# Patient Record
Sex: Female | Born: 1940 | Race: White | Hispanic: No | Smoking: Current every day smoker
Health system: Southern US, Community
[De-identification: ages and names within clinical notes are randomized; demographics above are authoritative.]

## PROBLEM LIST (undated history)

## (undated) DIAGNOSIS — E7800 Pure hypercholesterolemia, unspecified: Secondary | ICD-10-CM

## (undated) DIAGNOSIS — M549 Dorsalgia, unspecified: Secondary | ICD-10-CM

## (undated) DIAGNOSIS — F488 Other specified nonpsychotic mental disorders: Secondary | ICD-10-CM

## (undated) DIAGNOSIS — G8929 Other chronic pain: Secondary | ICD-10-CM

## (undated) DIAGNOSIS — F0390 Unspecified dementia without behavioral disturbance: Secondary | ICD-10-CM

## (undated) DIAGNOSIS — J449 Chronic obstructive pulmonary disease, unspecified: Secondary | ICD-10-CM

## (undated) HISTORY — PX: CHOLECYSTECTOMY: SHX55

## (undated) HISTORY — PX: HIP SURGERY: SHX245

---

## 2002-03-10 ENCOUNTER — Ambulatory Visit (HOSPITAL_COMMUNITY): Admission: RE | Admit: 2002-03-10 | Discharge: 2002-03-10 | Payer: Self-pay | Admitting: Internal Medicine

## 2002-03-10 ENCOUNTER — Encounter: Payer: Self-pay | Admitting: Internal Medicine

## 2002-04-14 ENCOUNTER — Other Ambulatory Visit: Admission: RE | Admit: 2002-04-14 | Discharge: 2002-04-14 | Payer: Self-pay | Admitting: *Deleted

## 2003-03-11 ENCOUNTER — Encounter: Payer: Self-pay | Admitting: *Deleted

## 2003-03-11 ENCOUNTER — Ambulatory Visit (HOSPITAL_COMMUNITY): Admission: RE | Admit: 2003-03-11 | Discharge: 2003-03-11 | Payer: Self-pay | Admitting: *Deleted

## 2003-04-20 ENCOUNTER — Ambulatory Visit (HOSPITAL_COMMUNITY): Admission: RE | Admit: 2003-04-20 | Discharge: 2003-04-20 | Payer: Self-pay | Admitting: Internal Medicine

## 2003-04-20 ENCOUNTER — Encounter: Payer: Self-pay | Admitting: Internal Medicine

## 2004-03-20 ENCOUNTER — Ambulatory Visit (HOSPITAL_COMMUNITY): Admission: RE | Admit: 2004-03-20 | Discharge: 2004-03-20 | Payer: Self-pay | Admitting: *Deleted

## 2004-06-06 ENCOUNTER — Ambulatory Visit (HOSPITAL_COMMUNITY): Admission: RE | Admit: 2004-06-06 | Discharge: 2004-06-06 | Payer: Self-pay | Admitting: *Deleted

## 2005-10-16 HISTORY — PX: COLONOSCOPY: SHX174

## 2008-04-07 ENCOUNTER — Ambulatory Visit (HOSPITAL_COMMUNITY): Admission: RE | Admit: 2008-04-07 | Discharge: 2008-04-07 | Payer: Self-pay | Admitting: Internal Medicine

## 2009-12-12 ENCOUNTER — Encounter: Payer: Self-pay | Admitting: Pulmonary Disease

## 2010-01-11 ENCOUNTER — Ambulatory Visit: Payer: Self-pay | Admitting: Pulmonary Disease

## 2010-01-11 DIAGNOSIS — I6350 Cerebral infarction due to unspecified occlusion or stenosis of unspecified cerebral artery: Secondary | ICD-10-CM | POA: Insufficient documentation

## 2010-01-11 DIAGNOSIS — E785 Hyperlipidemia, unspecified: Secondary | ICD-10-CM

## 2010-01-11 DIAGNOSIS — E119 Type 2 diabetes mellitus without complications: Secondary | ICD-10-CM | POA: Insufficient documentation

## 2010-01-11 DIAGNOSIS — J309 Allergic rhinitis, unspecified: Secondary | ICD-10-CM | POA: Insufficient documentation

## 2010-01-11 DIAGNOSIS — J438 Other emphysema: Secondary | ICD-10-CM | POA: Insufficient documentation

## 2010-12-24 ENCOUNTER — Encounter: Payer: Self-pay | Admitting: *Deleted

## 2011-01-02 NOTE — Assessment & Plan Note (Signed)
Summary: self referral for emphysema, dyspnea   Copy to:  Self Referral Primary Provider/Referring Provider:  Toni Amend in [place], [place]  CC:  Pulmonary Consult.  History of Present Illness: The pt is a 70y/o female who comes in today for evaluation of dyspnea.  She has longstanding doe and has been told that she has COPD, but her sob has worsened to the point of interfering with any QOL.  She describes less than one block doe at a moderate pace on flat ground, and can also get sob just walking thru her house at times.  She will also get winded bringing a bag of groceries in from the car.  She has intermittant cough with white mucus, and this occurs primarily in the am's upon arising.  She has a long history of smoking, but quit 2 weeks ago.  She currently is on no bronchodilators.  She had a recent cxr last month with no acute process, but has not had pfts since 2003.  She tells me that she wears oxygen at 2lpm at bedtime.  Preventive Screening-Counseling & Management  Alcohol-Tobacco     Smoking Status: quit  Current Medications (verified): 1)  Lithium Carbonate 300 Mg Caps (Lithium Carbonate) .... Take 1 Tablet By Mouth Two Times A Day 2)  Klonopin 0.5 Mg Tabs (Clonazepam) .... Take 1 Tablet By Mouth Two Times A Day 3)  Thiothixene 2 Mg Caps (Thiothixene) .... Take 2 Tabs By Mouth At Bedtime 4)  Aggrenox 25-200 Mg Xr12h-Cap (Aspirin-Dipyridamole) .... Take 1 Tablet By Mouth Two Times A Day 5)  Metformin Hcl 500 Mg Tabs (Metformin Hcl) .... Take 1 Tablet By Mouth Once A Day 6)  Fluoxetine Hcl 20 Mg Caps (Fluoxetine Hcl) .... Take 1 Tablet By Mouth Once A Day 7)  Simvastatin 40 Mg Tabs (Simvastatin) .... Take 1 Tablet By Mouth Once A Day  Allergies (verified): 1)  ! 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 emphysema: brother (copd), daughter allergies: daughters asthma: daughters cancer: sister (ovarian) sister ( cervix)   Social History: Reviewed history and no changes required. Patient states former smoker.  started at age 32.  1 ppd.  Quit Jan 2011 pt is widowed. pt has children. pt lives with self and a son. pt is a Arts development officer. Smoking Status:  quit  Review of Systems       The patient complains of shortness of breath with activity, productive cough, non-productive cough, indigestion, sneezing, itching, anxiety, depression, hand/feet swelling, and joint stiffness or pain.  The patient denies shortness of breath at rest, coughing up blood, chest pain, irregular heartbeats, acid heartburn, loss of appetite, weight change, abdominal pain, difficulty swallowing, sore throat, tooth/dental problems, headaches, nasal congestion/difficulty breathing through nose, ear ache, rash, change in color of mucus, and fever.    Vital Signs:  Patient profile:   70 year old female Height:      63 inches Weight:      138.25 pounds BMI:     24.58 O2 Sat:      90 % on Room air Temp:     98.1 degrees F oral Pulse rate:   87 / minute BP sitting:   116 / 64  (left arm) Cuff size:   regular  Vitals Entered By: Arman Filter LPN (January 11, 2010 2:47 PM)  O2  Flow:  Room air CC: Pulmonary Consult Comments Medications reviewed with patient  Arman Filter LPN  January 11, 2010 2:47 PM    Physical Exam  General:  wd female in nad Eyes:  PERRLA and EOMI.   Nose:  patent without discharge Mouth:  clear  Neck:  no jvd, tmg, LN Lungs:  decreased bs bilat, no wheezing or rhonchi Heart:  rrr, no mrg Abdomen:  soft and nontender, bs+ Extremities:  no edema noted, no cyanosis pulses intact distally Neurologic:  alert and oriented, moves all 4.   Impression & Recommendations:  Problem # 1:  EMPHYSEMA (ICD-492.8) the pt significant doe that I suspect is due to  underlying emphysema.  She will need full pfts for verification.  She is not on any bronchodilators, and therefore will start her on symbicort/spiriva combination.  However, I have told her there is no medication that will make her better if she does not completely stop smoking.  Will start her on meds, and see her back in 3 weeks with full pfts.  I have also stressed to her the importance of an exercise/conditioning program.  Medications Added to Medication List This Visit: 1)  Lithium Carbonate 300 Mg Caps (Lithium carbonate) .... Take 1 tablet by mouth two times a day 2)  Klonopin 0.5 Mg Tabs (Clonazepam) .... Take 1 tablet by mouth two times a day 3)  Thiothixene 2 Mg Caps (Thiothixene) .... Take 2 tabs by mouth at bedtime 4)  Aggrenox 25-200 Mg Xr12h-cap (Aspirin-dipyridamole) .... Take 1 tablet by mouth two times a day 5)  Metformin Hcl 500 Mg Tabs (Metformin hcl) .... Take 1 tablet by mouth once a day 6)  Fluoxetine Hcl 20 Mg Caps (Fluoxetine hcl) .... Take 1 tablet by mouth once a day 7)  Simvastatin 40 Mg Tabs (Simvastatin) .... Take 1 tablet by mouth once a day 8)  Spiriva Handihaler 18 Mcg Caps (Tiotropium bromide monohydrate) .... One puff in handihaler daily 9)  Proair Hfa 108 (90 Base) Mcg/act Aers (Albuterol sulfate) .... 2 puffs every 4-6 hours as needed 10)  Symbicort 160-4.5 Mcg/act Aero (Budesonide-formoterol fumarate) .... Two puffs twice daily  Other Orders: New Patient Level IV (25366) Pulmonary Referral (Pulmonary)  Patient Instructions: 1)  start spiriva one inhalation each am 2)  symbicort 160/4.5  2 inhalations am and pm 3)  proair 2 puffs every 6hrs only if needed for emergencies 4)  Stay completely away from cigarettes. 5)  will schedule for breathing tests with followup visit same day with me in 3 weeks.  Prescriptions: SYMBICORT 160-4.5 MCG/ACT  AERO (BUDESONIDE-FORMOTEROL FUMARATE) Two puffs twice daily  #1 x 6   Entered and Authorized by:   Barbaraann Share  MD   Signed by:   Barbaraann Share MD on 01/11/2010   Method used:   Print then Give to Patient   RxID:   [id number] PROAIR HFA 108 (90 BASE) MCG/ACT  AERS (ALBUTEROL SULFATE) 2 puffs every 4-6 hours as needed  #1 x 6   Entered and Authorized by:   Barbaraann Share MD   Signed by:   Barbaraann Share MD on 01/11/2010   Method used:   Print then Give to Patient   RxID:   [id number] SPIRIVA HANDIHALER 18 MCG  CAPS (TIOTROPIUM BROMIDE MONOHYDRATE) one puff in handihaler daily  #30 x 6   Entered and Authorized by:   Barbaraann Share MD   Signed by:   Barbaraann Share MD on  01/11/2010   Method used:   Print then Give to Patient   RxID:   [id number]   Appended Document: self referral for emphysema, dyspnea received records from Dr. Nickola Major office and put in your very important look at folder.

## 2011-02-26 HISTORY — PX: ESOPHAGOGASTRODUODENOSCOPY ENDOSCOPY: SHX5814

## 2012-05-31 ENCOUNTER — Encounter (HOSPITAL_COMMUNITY): Payer: Self-pay

## 2012-05-31 ENCOUNTER — Emergency Department (HOSPITAL_COMMUNITY)
Admission: EM | Admit: 2012-05-31 | Discharge: 2012-06-01 | Disposition: A | Discharge status: Home / Self Care | Payer: Medicare Other | Attending: Emergency Medicine | Admitting: Emergency Medicine

## 2012-05-31 DIAGNOSIS — E119 Type 2 diabetes mellitus without complications: Secondary | ICD-10-CM | POA: Insufficient documentation

## 2012-05-31 DIAGNOSIS — F17200 Nicotine dependence, unspecified, uncomplicated: Secondary | ICD-10-CM | POA: Insufficient documentation

## 2012-05-31 DIAGNOSIS — R1013 Epigastric pain: Secondary | ICD-10-CM | POA: Insufficient documentation

## 2012-05-31 DIAGNOSIS — R1011 Right upper quadrant pain: Secondary | ICD-10-CM | POA: Insufficient documentation

## 2012-05-31 DIAGNOSIS — E7800 Pure hypercholesterolemia, unspecified: Secondary | ICD-10-CM | POA: Insufficient documentation

## 2012-05-31 DIAGNOSIS — R109 Unspecified abdominal pain: Secondary | ICD-10-CM

## 2012-05-31 HISTORY — DX: Other specified nonpsychotic mental disorders: F48.8

## 2012-05-31 HISTORY — DX: Pure hypercholesterolemia, unspecified: E78.00

## 2012-05-31 MED ORDER — PANTOPRAZOLE SODIUM 40 MG IV SOLR
40.0000 mg | Freq: Once | INTRAVENOUS | Status: AC
  Administered 2012-06-01: 40 mg via INTRAVENOUS
  Filled 2012-05-31: qty 40

## 2012-05-31 MED ORDER — ONDANSETRON HCL 4 MG/2ML IJ SOLN
4.0000 mg | Freq: Once | INTRAMUSCULAR | Status: AC
  Administered 2012-06-01: 4 mg via INTRAVENOUS
  Filled 2012-05-31: qty 2

## 2012-05-31 MED ORDER — MORPHINE SULFATE 2 MG/ML IJ SOLN
2.0000 mg | Freq: Once | INTRAMUSCULAR | Status: AC
  Administered 2012-06-01: 2 mg via INTRAVENOUS
  Filled 2012-05-31: qty 1

## 2012-05-31 MED ORDER — SODIUM CHLORIDE 0.9 % IV SOLN
Freq: Once | INTRAVENOUS | Status: AC
  Administered 2012-06-01: via INTRAVENOUS

## 2012-05-31 NOTE — ED Notes (Signed)
Feels like my gallbladder is acting up. Every time I eat, I burp per pt.

## 2012-05-31 NOTE — ED Provider Notes (Signed)
History   This chart was scribed for [employer]. Colon Branch, MD by Melba Coon. The patient was seen in room APA09/APA09 and the patient's care was started at 11:47PM.    CSN: [id number]  Arrival date & time 05/31/12  2310   First MD Initiated Contact with Patient 05/31/12 2340      Chief Complaint  Patient presents with  . Abdominal Pain    (Consider location/radiation/quality/duration/timing/severity/associated sxs/prior treatment) HPI Sara Silva is a 71 y.o. female who presents to the Emergency Department complaining of constant, moderate to severe abdominal pain with an onset 2 weeks ago. Pt believes that it may be her gall bladder; has had gall bladder attacks in the past. Eating aggravates the pain; burping slightly alleviates the pain. She states that she burps every time she eats, and every time she burps, her abdomen "hardens" . No HA, fever, neck pain, sore throat, rash, back pain, CP, SOB, n/v/d, dysuria, or extremity pain, edema, weakness, numbness, or tingling. Hx of DM. Allergic to sulfonamide derivatives. No other pertinent medical symptoms.  PCP: Dr. Calton Golds in [place]  Past Medical History  Diagnosis Date  . Diabetes mellitus   . High cholesterol   . Nervous breakdown     Past Surgical History  Procedure Date  . Hip surgery     History reviewed. No pertinent family history.  History  Substance Use Topics  . Smoking status: Current Some Day Smoker -- 0.5 packs/day  . Smokeless tobacco: Not on file  . Alcohol Use: No    OB History    Grav Para Term Preterm Abortions TAB SAB Ect Mult Living                  Review of Systems 10 Systems reviewed and all are negative for acute change except as noted in the HPI.   Allergies  Sulfonamide derivatives  Home Medications  No current outpatient prescriptions on file.  BP 139/88  Pulse 74  Temp 98 F (36.7 C) (Oral)  Resp 20  Ht 5\' 3"  (1.6 m)  Wt 111 lb (50.349 kg)  BMI 19.66 kg/m2  SpO2  97%  Physical Exam  Nursing note and vitals reviewed. Constitutional: She is oriented to person, place, and time. She appears well-developed and well-nourished. No distress.  HENT:  Head: Normocephalic and atraumatic.  Right Ear: External ear normal.  Left Ear: External ear normal.  Eyes: EOM are normal.  Neck: Normal range of motion. No tracheal deviation present.  Cardiovascular: Normal rate, regular rhythm and normal heart sounds.   No murmur heard. Pulmonary/Chest: Effort normal. No respiratory distress. She has wheezes (expiratory wheezing bilaterally). She has no rales.  Abdominal: Soft. Bowel sounds are normal. There is tenderness (RUQ and epigastric TTP).  Musculoskeletal: Normal range of motion. She exhibits no edema and no tenderness.  Neurological: She is alert and oriented to person, place, and time.  Skin: Skin is warm and dry. No rash noted.  Psychiatric: She has a normal mood and affect. Her behavior is normal.    ED Course  Procedures (including critical care time)  DIAGNOSTIC STUDIES: Oxygen Saturation is 97% on room air, normal by my interpretation.    COORDINATION OF CARE:  11:52PM - EDMD will order abd CT, blood w/u, protonix injection, and morphine for the pt. Pt wants to come back to the ED for the ultrasound. Results for orders placed during the hospital encounter of 05/31/12  CBC WITH DIFFERENTIAL      Component Value  Range   WBC 6.6  4.0 - 10.5 K/uL   RBC 3.47 (*) 3.87 - 5.11 MIL/uL   Hemoglobin 11.7 (*) 12.0 - 15.0 g/dL   HCT 16.1 (*) 09.6 - 04.5 %   MCV 98.8  78.0 - 100.0 fL   MCH 33.7  26.0 - 34.0 pg   MCHC 34.1  30.0 - 36.0 g/dL   RDW 40.9  81.1 - 91.4 %   Platelets 217  150 - 400 K/uL   Neutrophils Relative 43  43 - 77 %   Neutro Abs 2.9  1.7 - 7.7 K/uL   Lymphocytes Relative 43  12 - 46 %   Lymphs Abs 2.9  0.7 - 4.0 K/uL   Monocytes Relative 6  3 - 12 %   Monocytes Absolute 0.4  0.1 - 1.0 K/uL   Eosinophils Relative 7 (*) 0 - 5 %    Eosinophils Absolute 0.4  0.0 - 0.7 K/uL   Basophils Relative 1  0 - 1 %   Basophils Absolute 0.0  0.0 - 0.1 K/uL  COMPREHENSIVE METABOLIC PANEL      Component Value Range   Sodium 138  135 - 145 mEq/L   Potassium 4.0  3.5 - 5.1 mEq/L   Chloride 104  96 - 112 mEq/L   CO2 27  19 - 32 mEq/L   Glucose, Bld 88  70 - 99 mg/dL   BUN 17  6 - 23 mg/dL   Creatinine, Ser 7.82  0.50 - 1.10 mg/dL   Calcium 9.9  8.4 - 95.6 mg/dL   Total Protein 6.4  6.0 - 8.3 g/dL   Albumin 3.7  3.5 - 5.2 g/dL   AST 15  0 - 37 U/L   ALT 10  0 - 35 U/L   Alkaline Phosphatase 49  39 - 117 U/L   Total Bilirubin 0.2 (*) 0.3 - 1.2 mg/dL   GFR calc non Af Amer 54 (*) >90 mL/min   GFR calc Af Amer 62 (*) >90 mL/min   Dg Abd Acute W/chest  06/01/2012  *RADIOLOGY REPORT*  Clinical Data: Right upper quadrant pain  ACUTE ABDOMEN SERIES (ABDOMEN 2 VIEW & CHEST 1 VIEW)  Comparison: 06/06/2004 chest radiograph  Findings: Hyperinflation.  Nodular opacity projecting over the right lower lobe likely corresponds to a superimposed shadow. Aortic arch atherosclerosis.  Nonobstructive bowel gas pattern.  No free intraperitoneal air. Rightward curvature of the lumbar spine.  No acute osseous finding. Right hip screws are partially imaged. Atherosclerotic vascular calcification.  IMPRESSION: Nonobstructive bowel gas pattern.  Original Report Authenticated By: Waneta Martins, M.D.     MDM  Patient with right upper quadrant and epigastric discomfort that has resulted in pain and burping after eating. She has had similar episodes over the last few months and has been told it is her gallbladder. Laboratory data is normal. Acute abdomen series is negative for any acute findings. She is scheduled for ultrasound on Monday at 2 PM. Referral to surgery has been made. She was given IV fluids, morphine, Zofran, and Protonix with relief of the discomfort and burping.Dx testing d/w pt and family.  Questions answered.  Verb understanding.  Pt feels  improved after observation and/or treatment in ED.Pt stable in ED with no significant deterioration in condition.The patient appears reasonably screened and/or stabilized for discharge and I doubt any other medical condition or other [facility] requiring further screening, evaluation, or treatment in the ED at this time prior to discharge.  I personally  performed the services described in this documentation, which was scribed in my presence. The recorded information has been reviewed and considered.   MDM Reviewed: nursing note and vitals Interpretation: labs and x-ray          Nicoletta Dress. Colon Branch, MD 06/01/12 2126

## 2012-06-01 ENCOUNTER — Emergency Department (HOSPITAL_COMMUNITY): Payer: Medicare Other

## 2012-06-01 LAB — CBC WITH DIFFERENTIAL/PLATELET
Basophils Absolute: 0 10*3/uL (ref 0.0–0.1)
Basophils Relative: 1 % (ref 0–1)
Eosinophils Absolute: 0.4 10*3/uL (ref 0.0–0.7)
Hemoglobin: 11.7 g/dL — ABNORMAL LOW (ref 12.0–15.0)
MCH: 33.7 pg (ref 26.0–34.0)
MCHC: 34.1 g/dL (ref 30.0–36.0)
Monocytes Absolute: 0.4 10*3/uL (ref 0.1–1.0)
Monocytes Relative: 6 % (ref 3–12)
Neutrophils Relative %: 43 % (ref 43–77)
RDW: 14 % (ref 11.5–15.5)

## 2012-06-01 LAB — COMPREHENSIVE METABOLIC PANEL
AST: 15 U/L (ref 0–37)
Albumin: 3.7 g/dL (ref 3.5–5.2)
BUN: 17 mg/dL (ref 6–23)
Creatinine, Ser: 1.02 mg/dL (ref 0.50–1.10)
Potassium: 4 mEq/L (ref 3.5–5.1)
Total Protein: 6.4 g/dL (ref 6.0–8.3)

## 2012-06-01 MED ORDER — HYDROCODONE-ACETAMINOPHEN 5-325 MG PO TABS: 1.0000 | ORAL_TABLET | ORAL | Status: AC | PRN

## 2012-06-01 MED ORDER — ONDANSETRON HCL 4 MG PO TABS: 4.0000 mg | ORAL_TABLET | Freq: Four times a day (QID) | ORAL | Status: AC

## 2012-06-01 NOTE — ED Notes (Signed)
Outpatient [place] was scheduled for Monday morning. Pt called and canceled with Samule Ohm from X-Ray. Pt stated that she was going to go somewhere closer to home.

## 2012-06-01 NOTE — Discharge Instructions (Signed)
Your bloodwork was normal here tonight. Your xrays were normal except for a nodule in the lung that can be followed by your primary care doctor. You are scheduled for an ultrasound on Monday, June 02, 2012 at 2:00 PM. Go directly to the radiology department. Once your ultrasound is finished you will go to the ER waiting room and the ER doctor on duty will review your results with you. Have nothing to eat or drink for 6 hours prior to the ultrasound. I have given you the na,es of 2 surgeons in [place]. Use the pain and nausea medicine as needed.

## 2012-06-02 ENCOUNTER — Other Ambulatory Visit (HOSPITAL_COMMUNITY): Payer: Medicare Other

## 2015-01-03 ENCOUNTER — Encounter: Payer: Self-pay | Admitting: Nurse Practitioner

## 2015-01-03 ENCOUNTER — Ambulatory Visit (INDEPENDENT_AMBULATORY_CARE_PROVIDER_SITE_OTHER): Payer: Medicare Other | Admitting: Nurse Practitioner

## 2015-01-03 VITALS — BP 126/73 | HR 95 | Temp 97.6°F | Ht 62.0 in | Wt 125.0 lb

## 2015-01-03 DIAGNOSIS — R1030 Lower abdominal pain, unspecified: Secondary | ICD-10-CM

## 2015-01-03 DIAGNOSIS — R109 Unspecified abdominal pain: Secondary | ICD-10-CM | POA: Insufficient documentation

## 2015-01-03 DIAGNOSIS — R197 Diarrhea, unspecified: Secondary | ICD-10-CM

## 2015-01-03 NOTE — Progress Notes (Signed)
Primary Care Physician:  Marjo Bicker, MD Primary Gastroenterologist:  Dr. Gala Romney  Chief Complaint  Patient presents with  . Abdominal Pain    HPI:   74 year old female presents for abdominal pain. Has had this consistently in her history and has a history of GERD. EGD on 02/26/11 showed small hiatal hernia, no Barrett's, esophagitis, or stricture. Normal stomach, pyloric channel, and duodenum with exception of antral gastritis. Biopsies of antrum and esophagus taken. Bougle 54 dilator passed. Esophageal bx showed benign squamous mucosa. Antral bx showed mild chronic gastropathy, no H. Pylori. Last colonosocpy on 10/16/2005 showed good prep, good viability, no polyps of inflammatory disease, normal colonoscopy throughout. Was seen at [facility] ER on 10/07/14 for abdominal pain. Records provided by [facility] was copy of patient instructions which showed dx of colitis with Cipro and Flagyl Rx provided as well as norco for pain. CT A&P without contrast done, shows absent gallbladder, liver, spleen, adrenals, and pancreas grossly normal, 2 mm right kidney lower pole stone, no RP adenopathy; pericolonic inflammatory change adjacent to splenic flexure and majority of descending colon and thickening of bowel wall likely mild diffuse colitis; moderate sigmoid diverticulosis without acute inflammation.  Patient is a poor historian, unable to recall medications. Thinks she's on Omeprazole once a day. States GERD symptoms are well controlled on this. States abdominal pain has continued despite treatment. States abdominal pain is intermittent and usually after eating and before having a bowel movement. States the pain is improved after her bowel movement. Has about 4-5 bowel movements a day, sometimes small hard stools and sometimes they're loose. Stools are loose 2-3 days a week. Has had fecal incontinence. Was put on Questran packet by another GI doctor (Dr. Scharlene Gloss) 3 weeks ago in [place] which she states helps  her loose stools but makes her constipated. States she came to see [place] on follow-up after that because she does not like [place]. Denies hematochezia. States her bowel movements have been dark brown, but not black, for the past year with her loose stools. Denies N/V since ER visit, denies dysphagia. Denies any other upper or lower GI symptoms.  Past Medical History  Diagnosis Date  . Diabetes mellitus   . High cholesterol   . Nervous breakdown     Past Surgical History  Procedure Laterality Date  . Hip surgery      Current Outpatient Prescriptions  Medication Sig Dispense Refill  . aspirin 81 MG tablet Take 81 mg by mouth daily.    . cholestyramine (QUESTRAN) 4 G packet Take 4 g by mouth daily.    . clonazePAM (KLONOPIN) 0.5 MG tablet Take 0.5 mg by mouth 2 (two) times daily.    Marland Kitchen FLUoxetine (PROZAC) 20 MG capsule Take 20 mg by mouth daily.    Marland Kitchen lithium carbonate 300 MG capsule Take 300 mg by mouth 2 (two) times daily with a meal.    . metFORMIN (GLUCOPHAGE) 500 MG tablet Take 500 mg by mouth 2 (two) times daily with a meal.    . QUEtiapine (SEROQUEL) 100 MG tablet Take 100 mg by mouth at bedtime.     No current facility-administered medications for this visit.    Allergies as of 01/03/2015 - Review Complete 01/03/2015  Allergen Reaction Noted  . Sulfonamide derivatives Hives     No family history on file.  History   Social History  . Marital Status: Widowed    Spouse Name: N/A    Number of Children: N/A  .  Years of Education: N/A   Occupational History  . Not on file.   Social History Main Topics  . Smoking status: Current Some Day Smoker -- 1.00 packs/day    Types: Cigarettes  . Smokeless tobacco: Not on file  . Alcohol Use: No  . Drug Use: No  . Sexual Activity: Not on file   Other Topics Concern  . Not on file   Social History Narrative    Review of Systems: Gen: Denies any fever, chills, fatigue, weight loss, lack of appetite.  CV: Denies chest pain,  heart palpitations, peripheral edema, syncope.  Resp: Denies shortness of breath at rest or with exertion. Denies wheezing or cough.  GI: See HPI. GU : Denies urinary burning, urinary frequency, urinary hesitancy MS: Denies joint pain, muscle weakness, cramps, or limitation of movement.  Derm: Denies rash, itching, dry skin Psych: Denies depression, anxiety, memory loss, and confusion Heme: Denies bruising, bleeding, and enlarged lymph nodes.  Physical Exam: BP 126/73 mmHg  Pulse 95  Temp(Src) 97.6 F (36.4 C)  Ht 5\' 2"  (1.575 m)  Wt 125 lb (56.7 kg)  BMI 22.86 kg/m2 General:   Alert and oriented. Pleasant and cooperative. Well-nourished and well-developed.  Head:  Normocephalic and atraumatic. Eyes:  Without icterus, sclera clear and conjunctiva pink.  Ears:  Normal auditory acuity. Nose:  No deformity, discharge,  or lesions. Mouth:  No deformity or lesions, oral mucosa pink.  Neck:  Supple, without mass or thyromegaly. Lungs:  Clear to auscultation bilaterally. No wheezes, rales, or rhonchi. No distress.  Heart:  S1, S2 present without murmurs appreciated.  Abdomen:  +BS, soft, non-tender and non-distended. No HSM noted. No guarding or rebound. No masses appreciated.  Rectal:  Deferred  Msk:  Symmetrical without gross deformities. Normal posture. Pulses:  Normal pulses noted. Extremities:  Without clubbing or edema. Neurologic:  Alert and  oriented x4;  grossly normal neurologically. Skin:  Intact without significant lesions or rashes. Cervical Nodes:  No significant cervical adenopathy. Psych:  Alert and cooperative. Normal mood and affect.     01/03/2015 11:31 AM

## 2015-01-03 NOTE — Patient Instructions (Signed)
1. We will request the ER records from [facility] 2. Have your labs drawn in the next 1-2 days 3. When you finish the stool test, bring it back to [place] so we can send it to the lab 4. I am trying you on a medication called Viberzi 75 mg twice a day to see if it helps your diarrhea/abdominal pain without making you constipated. We are giving you samples for 2 weeks worth, please call near the end of the 2 weeks and let us know how it's working for you. 5. Return for a follow-up in 3 weeks.

## 2015-01-05 LAB — CBC WITH DIFFERENTIAL/PLATELET
BASOS ABS: 0.1 10*3/uL (ref 0.0–0.1)
Basophils Relative: 1 % (ref 0–1)
EOS PCT: 6 % — AB (ref 0–5)
Eosinophils Absolute: 0.4 10*3/uL (ref 0.0–0.7)
HEMATOCRIT: 39.5 % (ref 36.0–46.0)
HEMOGLOBIN: 13.1 g/dL (ref 12.0–15.0)
Lymphocytes Relative: 38 % (ref 12–46)
Lymphs Abs: 2.7 10*3/uL (ref 0.7–4.0)
MCH: 32.1 pg (ref 26.0–34.0)
MCHC: 33.2 g/dL (ref 30.0–36.0)
MCV: 96.8 fL (ref 78.0–100.0)
MONO ABS: 0.5 10*3/uL (ref 0.1–1.0)
MPV: 10.4 fL (ref 8.6–12.4)
Monocytes Relative: 7 % (ref 3–12)
Neutro Abs: 3.5 10*3/uL (ref 1.7–7.7)
Neutrophils Relative %: 48 % (ref 43–77)
Platelets: 287 10*3/uL (ref 150–400)
RBC: 4.08 MIL/uL (ref 3.87–5.11)
RDW: 14 % (ref 11.5–15.5)
WBC: 7.2 10*3/uL (ref 4.0–10.5)

## 2015-01-05 LAB — COMPREHENSIVE METABOLIC PANEL
ALBUMIN: 3.9 g/dL (ref 3.5–5.2)
ALT: 12 U/L (ref 0–35)
AST: 15 U/L (ref 0–37)
Alkaline Phosphatase: 64 U/L (ref 39–117)
BILIRUBIN TOTAL: 0.3 mg/dL (ref 0.2–1.2)
BUN: 12 mg/dL (ref 6–23)
CHLORIDE: 108 meq/L (ref 96–112)
CO2: 25 meq/L (ref 19–32)
Calcium: 9.6 mg/dL (ref 8.4–10.5)
Creat: 0.76 mg/dL (ref 0.50–1.10)
Glucose, Bld: 65 mg/dL — ABNORMAL LOW (ref 70–99)
Potassium: 4.1 mEq/L (ref 3.5–5.3)
SODIUM: 143 meq/L (ref 135–145)
Total Protein: 6.4 g/dL (ref 6.0–8.3)

## 2015-01-06 NOTE — Assessment & Plan Note (Signed)
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 Associated abdominal pain after eating which is relieved with bowel movement. No brbpr or mucus noted. Question of melena although seems to be more diet-related darker stools. Will check CBC, iFOBT, and CMP. Will trial on Viberzi samples x 2 weeks to see if this helps her symptoms. Patient to notify us of how this works for her and will call in Rx if effective. Return for follow-up in 3 weeks to reassess. Was seen in November at [facility] for colitis with abx treatment, but diarrhea unlikely infectious post antibiotics because of duration. If no improvement at follow-up or something abnormal in labs can consider further workup including possible imaging or colonoscopy.

## 2015-01-06 NOTE — Assessment & Plan Note (Signed)
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 Associated abdominal pain after eating which is relieved with bowel movement. No brbpr or mucus noted. Question of melena although seems to be more diet-related darker stools. Will check CBC, iFOBT, and CMP. Will trial on Viberzi samples x 2 weeks to see if this helps her symptoms. Patient to notify us of how this works for her and will call in Rx if effective. Return for follow-up in 3 weeks to reassess. Was seen in November at [facility] for colitis with abx treatment, but diarrhea unlikely infectious post antibiotics because of duration. If no improvement at follow-up or something abnormal in labs can consider further workup including possible imaging or colonoscopy.

## 2015-01-12 ENCOUNTER — Ambulatory Visit (INDEPENDENT_AMBULATORY_CARE_PROVIDER_SITE_OTHER): Payer: Medicare Other

## 2015-01-12 DIAGNOSIS — R109 Unspecified abdominal pain: Secondary | ICD-10-CM

## 2015-01-12 NOTE — Progress Notes (Signed)
PT returned one iFOBT and it was negative.

## 2015-01-13 LAB — IFOBT (OCCULT BLOOD): IMMUNOLOGICAL FECAL OCCULT BLOOD TEST: NEGATIVE

## 2015-01-18 NOTE — Progress Notes (Signed)
Quick Note:  Pt is aware of result. She said she only took 4 of the Viberzi and they locked up her bowels. She had to take some Milk of magnesia. She is doing better but still has problems with bowels being loose. Please advise! ______

## 2015-01-22 NOTE — Progress Notes (Signed)
CC'ED TO PCP 

## 2015-02-18 ENCOUNTER — Ambulatory Visit (INDEPENDENT_AMBULATORY_CARE_PROVIDER_SITE_OTHER): Payer: Medicare Other | Admitting: Internal Medicine

## 2015-02-18 ENCOUNTER — Other Ambulatory Visit: Payer: Self-pay

## 2015-02-18 ENCOUNTER — Encounter: Payer: Self-pay | Admitting: Internal Medicine

## 2015-02-18 VITALS — BP 133/72 | HR 91 | Temp 97.0°F | Ht 62.0 in | Wt 124.6 lb

## 2015-02-18 DIAGNOSIS — R194 Change in bowel habit: Secondary | ICD-10-CM

## 2015-02-18 DIAGNOSIS — R6881 Early satiety: Secondary | ICD-10-CM

## 2015-02-18 DIAGNOSIS — R198 Other specified symptoms and signs involving the digestive system and abdomen: Secondary | ICD-10-CM

## 2015-02-18 DIAGNOSIS — K921 Melena: Secondary | ICD-10-CM | POA: Diagnosis not present

## 2015-02-18 MED ORDER — PEG 3350-KCL-NA BICARB-NACL 420 G PO SOLR: 4000.0000 mL | ORAL | Status: DC

## 2015-02-18 NOTE — Progress Notes (Signed)
Primary Care Physician:  Marjo Bicker, MD Primary Gastroenterologist:  Dr. Gala Romney  Pre-Procedure History & Physical: HPI:  Sara Silva is a 74 y.o. female here for for evaluation of altered bowel function.  Has had issues with diarrhea over the past couple of years occasionally swings back constipation. Has been on Questran which made her too constipated. We saw her recently and tried a course of Verbizi; -this agent as well "locked her up"; she stopped taking it. No hematochezia. She describes melena intermittently. She also describes early satiety and frequent reflux symptoms on no acid suppression therapy. No dysphagia or odynophagia. Denies weight loss at this time. It's been 10 years since she had a colonoscopy; 4 years since she had an EGD. No nonsteroidal agents.  Recent CBC looked good except for slightly elevated eosinophil count (6%); Chem-12 look good except for a slightly depressed blood glucose  Past Medical History  Diagnosis Date  . Diabetes mellitus   . High cholesterol   . Nervous breakdown     Past Surgical History  Procedure Laterality Date  . Hip surgery    . Esophagogastroduodenoscopy endoscopy  02/26/11    Dr.Shifflet- small hiatla hernia, normal stomach except antal gastritis. esophageal bx= benign squamous mucosa, antrum bx= mild chronic gastrophathy.   . Colonoscopy  10/16/2005    [place], [place]- no polyps or inflammatory disease, normal tcs.  . Cholecystectomy      Prior to Admission medications   Medication Sig Start Date End Date Taking? Authorizing Provider  aspirin 81 MG tablet Take 81 mg by mouth daily.   Yes Historical Provider, MD  cholestyramine Lucrezia Starch) 4 G packet Take 4 g by mouth daily.   Yes Historical Provider, MD  clonazePAM (KLONOPIN) 0.5 MG tablet Take 0.5 mg by mouth 2 (two) times daily.   Yes Historical Provider, MD  FLUoxetine (PROZAC) 20 MG capsule Take 20 mg by mouth daily.   Yes Historical Provider, MD  lithium carbonate 300  MG capsule Take 300 mg by mouth 2 (two) times daily with a meal.   Yes Historical Provider, MD  metFORMIN (GLUCOPHAGE) 500 MG tablet Take 500 mg by mouth 2 (two) times daily with a meal.   Yes Historical Provider, MD  QUEtiapine (SEROQUEL) 100 MG tablet Take 100 mg by mouth at bedtime.   Yes Historical Provider, MD  simvastatin (ZOCOR) 10 MG tablet Take 10 mg by mouth daily.   Yes Historical Provider, MD    Allergies as of 02/18/2015 - Review Complete 02/18/2015  Allergen Reaction Noted  . Sulfonamide derivatives Hives     Family History  Problem Relation Age of Onset  . Colon cancer Neg Hx   . Ovarian cancer Sister     History   Social History  . Marital Status: Widowed    Spouse Name: N/A  . Number of Children: N/A  . Years of Education: N/A   Occupational History  . Not on file.   Social History Main Topics  . Smoking status: Current Some Day Smoker -- 1.00 packs/day    Types: Cigarettes  . Smokeless tobacco: Not on file  . Alcohol Use: No  . Drug Use: No  . Sexual Activity: Not on file   Other Topics Concern  . Not on file   Social History Narrative    Review of Systems: See HPI, otherwise negative ROS  Physical Exam: BP 133/72 mmHg  Pulse 91  Temp(Src) 97 F (36.1 C)  Ht 5\' 2"  (1.575 m)  Wt 124 lb 9.6 oz (56.518 kg)  BMI 22.78 kg/m2 General:   well-nourished, pleasant and cooperative in NAD. Accompanied by her daughter. Skin:  Intact without significant lesions or rashes. Eyes:  Sclera clear, no icterus.   Conjunctiva pink. Ears:  Normal auditory acuity. Nose:  No deformity, discharge,  or lesions. Mouth:  No deformity or lesions. Neck:  Supple; no masses or thyromegaly. No significant cervical adenopathy. Lungs:  Clear throughout to auscultation.   No wheezes, crackles, or rhonchi. No acute distress. Heart:  Regular rate and rhythm; no murmurs, clicks, rubs,  or gallops. Abdomen: Non-distended, normal bowel sounds.  Soft and nontender without  appreciable mass or hepatosplenomegaly.  Pulses:  Normal pulses noted. Extremities:  Without clubbing or edema. Rectal: Deferred until time of colonoscopy  Impression:  74 year old lady with multiple GI issues as described. Alternating constipation diarrhea with a slant towards diarrhea. Likely irritable bowel syndrome. Microscopic colitis less likely. Has been 10 years and she had a colonoscopy. Early satiety and frequent GERD also issues that need to be dealt into further.  Recommendations:  Offered a diagnostic EGD and colonoscopy to further evaluate early satiety GERD, reported melena and altered bowel function.The risks, benefits, limitations, imponderables and alternatives regarding both EGD and colonoscopy have been reviewed with the patient. Questions have been answered. All parties agreeable.    Notice: This dictation was prepared with Dragon dictation along with smaller phrase technology. Any transcriptional errors that result from this process are unintentional and may not be corrected upon review.

## 2015-02-18 NOTE — Patient Instructions (Addendum)
Schedule a diagnostic colonoscopy and EGD (melena, altered bowel function)  Will need deep sedation with Dr. Patsey Berthold (Propofol)  Split Movie prep  Further recommendations to follow

## 2015-02-21 ENCOUNTER — Other Ambulatory Visit: Payer: Self-pay

## 2015-02-21 MED ORDER — PEG 3350-KCL-NA BICARB-NACL 420 G PO SOLR: 4000.0000 mL | Freq: Once | ORAL | Status: DC

## 2015-02-21 MED ORDER — PEG-KCL-NACL-NASULF-NA ASC-C 100 G PO SOLR
1.0000 | Freq: Once | ORAL | Status: DC
Start: 2015-02-21 — End: 2015-02-21

## 2015-02-24 ENCOUNTER — Telehealth: Payer: Self-pay

## 2015-02-24 NOTE — Telephone Encounter (Signed)
Pt is calling because her insurance will not pay for her TCS until November unless she is at high risk for cancer. Please advise

## 2015-02-27 NOTE — Telephone Encounter (Signed)
This is not a screening tcs; it is a diagnostic test.  They should provide a benefit

## 2015-02-27 NOTE — Telephone Encounter (Signed)
Ginger please call the insurance company to verify insurance benefits, please see [place] office visit note to reference the reason we are doing the tcs.

## 2015-03-01 NOTE — Telephone Encounter (Signed)
Talked with Lorriane Shire at [employer] and they said that unless the patient has cancer they can not do the TCS until November.

## 2015-03-07 ENCOUNTER — Inpatient Hospital Stay (HOSPITAL_COMMUNITY): Admission: RE | Admit: 2015-03-07 | Payer: Medicare Other | Source: Ambulatory Visit

## 2015-03-10 ENCOUNTER — Ambulatory Visit (HOSPITAL_COMMUNITY): Admission: RE | Admit: 2015-03-10 | Payer: Medicare Other | Source: Ambulatory Visit | Admitting: Internal Medicine

## 2015-03-10 ENCOUNTER — Encounter (HOSPITAL_COMMUNITY): Admission: RE | Payer: Self-pay | Source: Ambulatory Visit

## 2015-03-10 SURGERY — COLONOSCOPY WITH PROPOFOL
Anesthesia: Monitor Anesthesia Care

## 2015-03-23 NOTE — Telephone Encounter (Signed)
I previously recommended both an EGD and a colonoscopy as diagnostic maneuvers given her new symptoms. Regardless of what the insurance company dictates, This is what I recommend I feel it is best practice.

## 2015-03-24 NOTE — Telephone Encounter (Signed)
Communication noted.  

## 2015-03-24 NOTE — Telephone Encounter (Addendum)
Pt understands but voices that she can not afford the out of pocket amount for the TCS. I told her that she did not have to pay it all at one time and she understood that but wanted to wait. She will call us if anything changes.

## 2015-08-31 NOTE — Telephone Encounter (Signed)
Pt called to let us know that she could have her TCS on 10/04/15. I will make her a follow up appointment.

## 2015-09-28 ENCOUNTER — Ambulatory Visit: Payer: Medicare Other | Admitting: Gastroenterology

## 2016-01-03 ENCOUNTER — Encounter: Payer: Self-pay | Admitting: Internal Medicine

## 2016-01-03 ENCOUNTER — Other Ambulatory Visit: Payer: Self-pay

## 2016-01-03 ENCOUNTER — Ambulatory Visit (INDEPENDENT_AMBULATORY_CARE_PROVIDER_SITE_OTHER): Payer: Medicare Other | Admitting: Internal Medicine

## 2016-01-03 VITALS — BP 149/75 | HR 87 | Temp 98.4°F | Ht 62.0 in | Wt 120.6 lb

## 2016-01-03 DIAGNOSIS — R634 Abnormal weight loss: Secondary | ICD-10-CM | POA: Diagnosis not present

## 2016-01-03 DIAGNOSIS — K219 Gastro-esophageal reflux disease without esophagitis: Secondary | ICD-10-CM

## 2016-01-03 DIAGNOSIS — R197 Diarrhea, unspecified: Secondary | ICD-10-CM

## 2016-01-03 MED ORDER — PEG 3350-KCL-NA BICARB-NACL 420 G PO SOLR: 4000.0000 mL | Freq: Once | ORAL | Status: DC

## 2016-01-03 NOTE — Patient Instructions (Signed)
Schedule Diagnostic EGD and Colonoscopy (GERD and diarrhea) - pediatric colonoscope  Needs propofol

## 2016-01-03 NOTE — Progress Notes (Signed)
Primary Care Physician:  Marjo Bicker, MD Primary Gastroenterologist:  Dr. Gala Romney  Pre-Procedure History & Physical: HPI:  Sara Silva is a 75 y.o. female here for you for reevaluation of her early satiety, refractory GERD and diarrhea. Patient seen here originally on 02/18/2015. These symptoms were to be worked up with a diagnostic EGD and colonoscopy. Because of insurance constraints, patient was unable to have them done at that time. She is here now and wants to proceed with the evaluation. She has lost 4 pounds and seen last year. She's not passed any blood. Denies dysphagia. She continues taking metformin. GI symptoms unchanged. Last colonoscopy good 11 years ago without significant findings. Last EGD 5 years ago.  She tried Prilosec previously and did not agree with her system. She also tried a course of Viberzi for diarrhea and it "locked her up". She has been taking Questran intermittently which also produced constipation.  Past Medical History  Diagnosis Date  . Diabetes mellitus   . High cholesterol   . Nervous breakdown     Past Surgical History  Procedure Laterality Date  . Hip surgery    . Esophagogastroduodenoscopy endoscopy  02/26/11    Dr.Shifflet- small hiatla hernia, normal stomach except antal gastritis. esophageal bx= benign squamous mucosa, antrum bx= mild chronic gastrophathy.   . Colonoscopy  10/16/2005    [place], [place]- no polyps or inflammatory disease, normal tcs.  . Cholecystectomy      Prior to Admission medications   Medication Sig Start Date End Date Taking? Authorizing Provider  clonazePAM (KLONOPIN) 0.5 MG tablet Take 0.5 mg by mouth 2 (two) times daily.   Yes Historical Provider, MD  FLUoxetine (PROZAC) 20 MG capsule Take 20 mg by mouth daily.   Yes Historical Provider, MD  lithium carbonate 300 MG capsule Take 300 mg by mouth 2 (two) times daily with a meal.   Yes Historical Provider, MD  metFORMIN (GLUCOPHAGE) 500 MG tablet Take 500 mg  by mouth 2 (two) times daily with a meal.   Yes Historical Provider, MD  QUEtiapine (SEROQUEL) 100 MG tablet Take 100 mg by mouth at bedtime.   Yes Historical Provider, MD  simvastatin (ZOCOR) 10 MG tablet Take 10 mg by mouth daily.   Yes Historical Provider, MD  aspirin 81 MG tablet Take 81 mg by mouth daily. Reported on 01/03/2016    Historical Provider, MD  cholestyramine Lucrezia Starch) 4 G packet Take 4 g by mouth daily. Reported on 01/03/2016    Historical Provider, MD  polyethylene glycol-electrolytes (NULYTELY/GOLYTELY) 420 G solution Take 4,000 mLs by mouth once. Patient not taking: Reported on 01/03/2016 02/21/15   Mahala Menghini, PA-C  polyethylene glycol-electrolytes (TRILYTE) 420 G solution Take 4,000 mLs by mouth as directed. Patient not taking: Reported on 01/03/2016 02/18/15   Daneil Dolin, MD    Allergies as of 01/03/2016 - Review Complete 01/03/2016  Allergen Reaction Noted  . Sulfonamide derivatives Hives     Family History  Problem Relation Age of Onset  . Colon cancer Neg Hx   . Ovarian cancer Sister     Social History   Social History  . Marital Status: Widowed    Spouse Name: N/A  . Number of Children: N/A  . Years of Education: N/A   Occupational History  . Not on file.   Social History Main Topics  . Smoking status: Current Some Day Smoker -- 1.00 packs/day    Types: Cigarettes  . Smokeless tobacco: Not on file  .  Alcohol Use: No  . Drug Use: No  . Sexual Activity: Not on file   Other Topics Concern  . Not on file   Social History Narrative    Review of Systems: See HPI, otherwise negative ROS  Physical Exam: BP 149/75 mmHg  Pulse 87  Temp(Src) 98.4 F (36.9 C)  Ht 5\' 2"  (1.575 m)  Wt 120 lb 9.6 oz (54.704 kg)  BMI 22.05 kg/m2 General:   Alert, small framed, elderly lady, pleasant and cooperative in NAD Skin:  Intact without significant lesions or rashes. Neck:  Supple; no masses or thyromegaly. No significant cervical adenopathy. Lungs:   Clear throughout to auscultation.   No wheezes, crackles, or rhonchi. No acute distress. Heart:  Regular rate and rhythm; no murmurs, clicks, rubs,  or gallops. Abdomen: Non-distended, normal bowel sounds.  Soft and nontender without appreciable mass or hepatosplenomegaly.  Pulses:  Normal pulses noted. Extremities:  Without clubbing or edema.  Impression:  Very pleasant 75 year old lady with chronic, refractory GERD, early satiety, weight loss along with chronic diarrhea. Further evaluation recommended as outlined previously. Patient is now able to undergo the evaluation. The possibility of microscopic colitis and medication effect( metformin) needs to be considered along with other entities as a cause for diarrhea. To this end, I have recommended a diagnostic EGD and colonoscopy.  Intolerance to Prilosec noted. The risks, benefits, limitations, imponderables and alternatives regarding both EGD and colonoscopy have been reviewed with the patient. Questions have been answered. All parties agreeable.    Recommendations:  Schedule Diagnostic EGD and Colonoscopy (GERD and diarrhea) - pediatric colonoscope with deep sedation  given co-morbidities and chronic medications.  Further recommendations to follow pending review of endoscopy findings..    Notice: This dictation was prepared with Dragon dictation along with smaller phrase technology. Any transcriptional errors that result from this process are unintentional and may not be corrected upon review.

## 2016-01-04 NOTE — Progress Notes (Signed)
Pt was set up for TCS on 01/26/16 and date had to be changed. Pt is aware of new date 01/30/16 and new instructions are in the mail.

## 2016-01-10 ENCOUNTER — Other Ambulatory Visit: Payer: Self-pay

## 2016-01-11 ENCOUNTER — Telehealth: Payer: Self-pay | Admitting: Internal Medicine

## 2016-01-11 NOTE — Telephone Encounter (Signed)
Called pt NO answer. Phone busy

## 2016-01-11 NOTE — Telephone Encounter (Signed)
Patient called earlier and spoke with CJ about her insurance, codes and upcoming procedure. She called back asking for CJ and I told her that CJ was with another patient and would have to call her back. [phone]

## 2016-01-12 NOTE — Telephone Encounter (Signed)
Spoke with pt and she wanted to inform us verified everything with her insurance

## 2016-01-25 ENCOUNTER — Other Ambulatory Visit: Payer: Self-pay

## 2016-01-25 NOTE — Patient Instructions (Signed)
Sara Silva  01/25/2016     @PREFPERIOPPHARMACY @   Your procedure is scheduled on 01/30/2016 .  Report to [facility] at 11:15 A.M.  Call this number if you have problems the morning of surgery:  [phone]   Remember:  Do not eat food or drink liquids after midnight.  Take these medicines the morning of surgery with A SIP OF WATER Klonopin, Prozac, Seroquel   Do not wear jewelry, make-up or nail polish.  Do not wear lotions, powders, or perfumes.  You may wear deodorant.  Do not shave 48 hours prior to surgery.  Men may shave face and neck.  Do not bring valuables to the hospital.  [facility] is not responsible for any belongings or valuables.  Contacts, dentures or bridgework may not be worn into surgery.  Leave your suitcase in the car.  After surgery it may be brought to your room.  For patients admitted to the hospital, discharge time will be determined by your treatment team.  Patients discharged the day of surgery will not be allowed to drive home.   Please read over the following fact sheets that you were given. Anesthesia Post-op Instructions     PATIENT INSTRUCTIONS POST-ANESTHESIA  IMMEDIATELY FOLLOWING SURGERY:  Do not drive or operate machinery for the first twenty four hours after surgery.  Do not make any important decisions for twenty four hours after surgery or while taking narcotic pain medications or sedatives.  If you develop intractable nausea and vomiting or a severe headache please notify your doctor immediately.  FOLLOW-UP:  Please make an appointment with your surgeon as instructed. You do not need to follow up with anesthesia unless specifically instructed to do so.  WOUND CARE INSTRUCTIONS (if applicable):  Keep a dry clean dressing on the anesthesia/puncture wound site if there is drainage.  Once the wound has quit draining you may leave it open to air.  Generally you should leave the bandage intact for twenty four hours unless there is  drainage.  If the epidural site drains for more than 36-48 hours please call the anesthesia department.  QUESTIONS?:  Please feel free to call your physician or the hospital operator if you have any questions, and they will be happy to assist you.      Esophagogastroduodenoscopy Esophagogastroduodenoscopy (EGD) is a procedure that is used to examine the lining of the esophagus, stomach, and first part of the small intestine (duodenum). A long, flexible, lighted tube with a camera attached (endoscope) is inserted down the throat to view these organs. This procedure is done to detect problems or abnormalities, such as inflammation, bleeding, ulcers, or growths, in order to treat them. The procedure lasts 5-20 minutes. It is usually an outpatient procedure, but it may need to be performed in a hospital in emergency cases. LET [facility] CARE PROVIDER KNOW ABOUT:  Any allergies you have.  All medicines you are taking, including vitamins, herbs, eye drops, creams, and over-the-counter medicines.  Previous problems you or members of your family have had with the use of anesthetics.  Any blood disorders you have.  Previous surgeries you have had.  Medical conditions you have. RISKS AND COMPLICATIONS Generally, this is a safe procedure. However, problems can occur and include:  Infection.  Bleeding.  Tearing (perforation) of the esophagus, stomach, or duodenum.  Difficulty breathing or not being able to breathe.  Excessive sweating.  Spasms of the larynx.  Slowed heartbeat.  Low blood pressure. BEFORE THE PROCEDURE  Do not eat or drink anything after midnight on the night before the procedure or as directed by your health care provider.  Do not take your regular medicines before the procedure if your health care provider asks you not to. Ask your health care provider about changing or stopping those medicines.  If you wear dentures, be prepared to remove them before the  procedure.  Arrange for someone to drive you home after the procedure. PROCEDURE  A numbing medicine (local anesthetic) may be sprayed in your throat for comfort and to stop you from gagging or coughing.  You will have an IV tube inserted in a vein in your hand or arm. You will receive medicines and fluids through this tube.  You will be given a medicine to relax you (sedative).  A pain reliever will be given through the IV tube.  A mouth guard may be placed in your mouth to protect your teeth and to keep you from biting on the endoscope.  You will be asked to lie on your left side.  The endoscope will be inserted down your throat and into your esophagus, stomach, and duodenum.  Air will be put through the endoscope to allow your health care provider to clearly view the lining of your esophagus.  The lining of your esophagus, stomach, and duodenum will be examined. During the exam, your health care provider may:  Remove tissue to be examined under a microscope (biopsy) for inflammation, infection, or other medical problems.  Remove growths.  Remove objects (foreign bodies) that are stuck.  Treat any bleeding with medicines or other devices that stop tissues from bleeding (hot cautery, clipping devices).  Widen (dilate) or stretch narrowed areas of your esophagus and stomach.  The endoscope will be withdrawn. AFTER THE PROCEDURE  You will be taken to a recovery area for observation. Your blood pressure, heart rate, breathing rate, and blood oxygen level will be monitored often until the medicines you were given have worn off.  Do not eat or drink anything until the numbing medicine has worn off and your gag reflex has returned. You may choke.  Your health care provider should be able to discuss his or her findings with you. It will take longer to discuss the test results if any biopsies were taken.   This information is not intended to replace advice given to you by your  health care provider. Make sure you discuss any questions you have with your health care provider.   Document Released: 03/22/2005 Document Revised: 12/10/2014 Document Reviewed: 10/22/2012 Elsevier Interactive Patient Education 2016 [employer]. Colonoscopy A colonoscopy is an exam to look at the entire large intestine (colon). This exam can help find problems such as tumors, polyps, inflammation, and areas of bleeding. The exam takes about 1 hour.  LET [facility] CARE PROVIDER KNOW ABOUT:   Any allergies you have.  All medicines you are taking, including vitamins, herbs, eye drops, creams, and over-the-counter medicines.  Previous problems you or members of your family have had with the use of anesthetics.  Any blood disorders you have.  Previous surgeries you have had.  Medical conditions you have. RISKS AND COMPLICATIONS  Generally, this is a safe procedure. However, as with any procedure, complications can occur. Possible complications include:  Bleeding.  Tearing or rupture of the colon wall.  Reaction to medicines given during the exam.  Infection (rare). BEFORE THE PROCEDURE   Ask your health care provider about changing or stopping your regular medicines.  You may be prescribed an oral bowel prep. This involves drinking a large amount of medicated liquid, starting the day before your procedure. The liquid will cause you to have multiple loose stools until your stool is almost clear or light green. This cleans out your colon in preparation for the procedure.  Do not eat or drink anything else once you have started the bowel prep, unless your health care provider tells you it is safe to do so.  Arrange for someone to drive you home after the procedure. PROCEDURE   You will be given medicine to help you relax (sedative).  You will lie on your side with your knees bent.  A long, flexible tube with a light and camera on the end (colonoscope) will be inserted through  the rectum and into the colon. The camera sends video back to a computer screen as it moves through the colon. The colonoscope also releases carbon dioxide gas to inflate the colon. This helps your health care provider see the area better.  During the exam, your health care provider may take a small tissue sample (biopsy) to be examined under a microscope if any abnormalities are found.  The exam is finished when the entire colon has been viewed. AFTER THE PROCEDURE   Do not drive for 24 hours after the exam.  You may have a small amount of blood in your stool.  You may pass moderate amounts of gas and have mild abdominal cramping or bloating. This is caused by the gas used to inflate your colon during the exam.  Ask when your test results will be ready and how you will get your results. Make sure you get your test results.   This information is not intended to replace advice given to you by your health care provider. Make sure you discuss any questions you have with your health care provider.   Document Released: 11/16/2000 Document Revised: 09/09/2013 Document Reviewed: 07/27/2013 Elsevier Interactive Patient Education [employer].

## 2016-01-26 ENCOUNTER — Other Ambulatory Visit: Payer: Self-pay

## 2016-01-26 ENCOUNTER — Encounter (HOSPITAL_COMMUNITY)
Admission: RE | Admit: 2016-01-26 | Discharge: 2016-01-26 | Disposition: A | Discharge status: Home / Self Care | Payer: Medicare Other | Source: Ambulatory Visit | Attending: Internal Medicine | Admitting: Internal Medicine

## 2016-01-26 ENCOUNTER — Encounter (HOSPITAL_COMMUNITY): Payer: Self-pay

## 2016-01-26 DIAGNOSIS — Z01812 Encounter for preprocedural laboratory examination: Secondary | ICD-10-CM | POA: Diagnosis not present

## 2016-01-26 DIAGNOSIS — Z01810 Encounter for preprocedural cardiovascular examination: Secondary | ICD-10-CM | POA: Insufficient documentation

## 2016-01-26 HISTORY — DX: Pure hypercholesterolemia, unspecified: E78.00

## 2016-01-26 HISTORY — DX: Chronic obstructive pulmonary disease, unspecified: J44.9

## 2016-01-26 HISTORY — DX: Dorsalgia, unspecified: M54.9

## 2016-01-26 HISTORY — DX: Other chronic pain: G89.29

## 2016-01-26 LAB — CBC
HCT: 40.2 % (ref 36.0–46.0)
HEMOGLOBIN: 13.1 g/dL (ref 12.0–15.0)
MCH: 32.4 pg (ref 26.0–34.0)
MCHC: 32.6 g/dL (ref 30.0–36.0)
MCV: 99.5 fL (ref 78.0–100.0)
PLATELETS: 340 10*3/uL (ref 150–400)
RBC: 4.04 MIL/uL (ref 3.87–5.11)
RDW: 15.1 % (ref 11.5–15.5)
WBC: 8.7 10*3/uL (ref 4.0–10.5)

## 2016-01-26 LAB — BASIC METABOLIC PANEL
ANION GAP: 6 (ref 5–15)
BUN: 16 mg/dL (ref 6–20)
CALCIUM: 9.5 mg/dL (ref 8.9–10.3)
CO2: 26 mmol/L (ref 22–32)
CREATININE: 0.66 mg/dL (ref 0.44–1.00)
Chloride: 105 mmol/L (ref 101–111)
Glucose, Bld: 84 mg/dL (ref 65–99)
Potassium: 4.5 mmol/L (ref 3.5–5.1)
SODIUM: 137 mmol/L (ref 135–145)

## 2016-01-26 NOTE — Pre-Procedure Instructions (Signed)
Patient given information to sign up for my chart at home. 

## 2016-01-30 ENCOUNTER — Encounter (HOSPITAL_COMMUNITY): Payer: Self-pay | Admitting: Anesthesiology

## 2016-01-30 ENCOUNTER — Ambulatory Visit (HOSPITAL_COMMUNITY): Payer: Medicare Other | Admitting: Anesthesiology

## 2016-01-30 ENCOUNTER — Encounter (HOSPITAL_COMMUNITY): Admission: RE | Payer: Self-pay | Source: Ambulatory Visit

## 2016-01-30 ENCOUNTER — Ambulatory Visit (HOSPITAL_COMMUNITY): Admission: RE | Admit: 2016-01-30 | Payer: Medicare Other | Source: Ambulatory Visit | Admitting: Internal Medicine

## 2016-01-30 ENCOUNTER — Encounter (HOSPITAL_COMMUNITY)
Admission: RE | Disposition: A | Discharge status: Home / Self Care | Payer: Self-pay | Source: Ambulatory Visit | Attending: Internal Medicine

## 2016-01-30 ENCOUNTER — Ambulatory Visit (HOSPITAL_COMMUNITY)
Admission: RE | Admit: 2016-01-30 | Discharge: 2016-01-30 | Disposition: A | Discharge status: Home / Self Care | Payer: Medicare Other | Source: Ambulatory Visit | Attending: Internal Medicine | Admitting: Internal Medicine

## 2016-01-30 DIAGNOSIS — Z79899 Other long term (current) drug therapy: Secondary | ICD-10-CM | POA: Insufficient documentation

## 2016-01-30 DIAGNOSIS — Q438 Other specified congenital malformations of intestine: Secondary | ICD-10-CM | POA: Diagnosis not present

## 2016-01-30 DIAGNOSIS — K529 Noninfective gastroenteritis and colitis, unspecified: Secondary | ICD-10-CM | POA: Insufficient documentation

## 2016-01-30 DIAGNOSIS — R197 Diarrhea, unspecified: Secondary | ICD-10-CM | POA: Insufficient documentation

## 2016-01-30 DIAGNOSIS — Z7982 Long term (current) use of aspirin: Secondary | ICD-10-CM | POA: Diagnosis not present

## 2016-01-30 DIAGNOSIS — F17210 Nicotine dependence, cigarettes, uncomplicated: Secondary | ICD-10-CM | POA: Diagnosis not present

## 2016-01-30 DIAGNOSIS — K5730 Diverticulosis of large intestine without perforation or abscess without bleeding: Secondary | ICD-10-CM | POA: Insufficient documentation

## 2016-01-30 DIAGNOSIS — K3189 Other diseases of stomach and duodenum: Secondary | ICD-10-CM | POA: Diagnosis not present

## 2016-01-30 DIAGNOSIS — E7800 Pure hypercholesterolemia, unspecified: Secondary | ICD-10-CM | POA: Diagnosis not present

## 2016-01-30 DIAGNOSIS — K2210 Ulcer of esophagus without bleeding: Secondary | ICD-10-CM | POA: Diagnosis not present

## 2016-01-30 DIAGNOSIS — Z7984 Long term (current) use of oral hypoglycemic drugs: Secondary | ICD-10-CM | POA: Insufficient documentation

## 2016-01-30 DIAGNOSIS — K2100 Gastro-esophageal reflux disease with esophagitis, without bleeding: Secondary | ICD-10-CM | POA: Insufficient documentation

## 2016-01-30 DIAGNOSIS — E119 Type 2 diabetes mellitus without complications: Secondary | ICD-10-CM | POA: Diagnosis not present

## 2016-01-30 DIAGNOSIS — K319 Disease of stomach and duodenum, unspecified: Secondary | ICD-10-CM | POA: Insufficient documentation

## 2016-01-30 DIAGNOSIS — K219 Gastro-esophageal reflux disease without esophagitis: Secondary | ICD-10-CM | POA: Diagnosis present

## 2016-01-30 HISTORY — PX: BIOPSY: SHX5522

## 2016-01-30 HISTORY — PX: COLONOSCOPY WITH PROPOFOL: SHX5780

## 2016-01-30 HISTORY — PX: ESOPHAGOGASTRODUODENOSCOPY (EGD) WITH PROPOFOL: SHX5813

## 2016-01-30 LAB — GLUCOSE, CAPILLARY
GLUCOSE-CAPILLARY: 74 mg/dL (ref 65–99)
Glucose-Capillary: 78 mg/dL (ref 65–99)

## 2016-01-30 SURGERY — COLONOSCOPY WITH PROPOFOL
Anesthesia: Monitor Anesthesia Care

## 2016-01-30 MED ORDER — ONDANSETRON HCL 4 MG/2ML IJ SOLN: 4.0000 mg | Freq: Once | INTRAMUSCULAR | Status: DC | PRN

## 2016-01-30 MED ORDER — MIDAZOLAM HCL 2 MG/2ML IJ SOLN
1.0000 mg | INTRAMUSCULAR | Status: DC | PRN
  Administered 2016-01-30: 2 mg via INTRAVENOUS
  Filled 2016-01-30: qty 2

## 2016-01-30 MED ORDER — FENTANYL CITRATE (PF) 100 MCG/2ML IJ SOLN: 25.0000 ug | INTRAMUSCULAR | Status: DC | PRN

## 2016-01-30 MED ORDER — LIDOCAINE VISCOUS 2 % MT SOLN
OROMUCOSAL | Status: AC
  Filled 2016-01-30: qty 15

## 2016-01-30 MED ORDER — LACTATED RINGERS IV SOLN
INTRAVENOUS | Status: DC
  Administered 2016-01-30: 12:00:00 via INTRAVENOUS

## 2016-01-30 MED ORDER — PROPOFOL 500 MG/50ML IV EMUL
INTRAVENOUS | Status: DC | PRN
  Administered 2016-01-30: 150 ug/kg/min via INTRAVENOUS

## 2016-01-30 MED ORDER — LIDOCAINE HCL (CARDIAC) 10 MG/ML IV SOLN
INTRAVENOUS | Status: DC | PRN
  Administered 2016-01-30: 25 mg via INTRAVENOUS

## 2016-01-30 MED ORDER — FENTANYL CITRATE (PF) 100 MCG/2ML IJ SOLN
25.0000 ug | INTRAMUSCULAR | Status: AC
  Administered 2016-01-30 (×2): 25 ug via INTRAVENOUS

## 2016-01-30 MED ORDER — ONDANSETRON HCL 4 MG/2ML IJ SOLN
4.0000 mg | Freq: Once | INTRAMUSCULAR | Status: AC
  Administered 2016-01-30: 4 mg via INTRAVENOUS

## 2016-01-30 MED ORDER — FENTANYL CITRATE (PF) 100 MCG/2ML IJ SOLN
INTRAMUSCULAR | Status: AC
  Filled 2016-01-30: qty 2

## 2016-01-30 MED ORDER — ONDANSETRON HCL 4 MG/2ML IJ SOLN
INTRAMUSCULAR | Status: AC
  Filled 2016-01-30: qty 2

## 2016-01-30 MED ORDER — BUTAMBEN-TETRACAINE-BENZOCAINE 2-2-14 % EX AERO
INHALATION_SPRAY | CUTANEOUS | Status: AC
  Filled 2016-01-30: qty 20

## 2016-01-30 MED ORDER — SODIUM CHLORIDE 0.9 % IV SOLN: INTRAVENOUS | Status: DC

## 2016-01-30 MED ORDER — LIDOCAINE VISCOUS 2 % MT SOLN
5.0000 mL | Freq: Once | OROMUCOSAL | Status: AC
  Administered 2016-01-30: 5 mL via OROMUCOSAL

## 2016-01-30 NOTE — Op Note (Signed)
[facility] [address] [place], [address]   ENDOSCOPY PROCEDURE REPORT  PATIENT: Kenzlee, Lamoureux  MR#: [id number] BIRTHDATE: 1941/07/26 , 14  yrs. old GENDER: female ENDOSCOPIST: R.  Garfield Cornea, MD [facility] REFERRED BY:  Dr. Lars Pinks PROCEDURE DATE:  02-10-2016 PROCEDURE:  EGD w/ biopsy INDICATIONS:  Refractory GERD. MEDICATIONS: Deep sedation per Dr.  [facility] ASA CLASS:      Class II  CONSENT: The risks, benefits, limitations, alternatives and imponderables have been discussed.  The potential for biopsy, esophogeal dilation, etc. have also been reviewed.  Questions have been answered.  All parties agreeable.  Please see the history and physical in the medical record for more information.  DESCRIPTION OF PROCEDURE: After the risks benefits and alternatives of the procedure were thoroughly explained, informed consent was obtained.  The [id number] endoscope was introduced through the mouth and advanced to the second portion of the duodenum , limited by Without limitations. The instrument was slowly withdrawn as the mucosa was fully examined. Estimated blood loss is zero unless otherwise noted in this procedure report.    Distal esophageal erosions within 3 mm the GE junction.  4 quadrant. No Barrett's epithelium tubular esophagus patent throughout its course. Stomach and deep. Patchy erythema linear erythema noted in the antrum and body. No ulcer or infiltrative process. Patent pylorus. Normal-appearing first and second of the duodenum Retroflexed views revealed no abnormalities.     The scope was then withdrawn from the patient and the procedure completed.Biopsies of the abnormal gastric mucosa taken for histologic study  COMPLICATIONS: There were no immediate complications. EBL 2 mL ENDOSCOPIC IMPRESSION: Erosive reflux esophagitis.   Abnormal gastric mucosa of uncertain significance -  status post  biopsy  RECOMMENDATIONS: Trial of Dexilant 60 mg daily?"patient is to go by my office for free samples. Follow-up pathology. See colonoscopy report.  REPEAT EXAM:  eSigned:  R. Garfield Cornea, MD Rosalita Chessman [facility] 02/10/2016 12:54 PM    CC:  CPT CODES: ICD CODES:  The ICD and CPT codes recommended by this software are interpretations from the data that the clinical staff has captured with the software.  The verification of the translation of this report to the ICD and CPT codes and modifiers is the sole responsibility of the health care institution and practicing physician where this report was generated.  [employer] will not be held responsible for the validity of the ICD and CPT codes included on this report.  AMA assumes no liability for data contained or not contained herein. CPT is a Designer, television/film set of the [employer].  PATIENT NAME:  Timothy, Corriveau MR#: [id number]

## 2016-01-30 NOTE — Interval H&P Note (Signed)
History and Physical Interval Note:  01/30/2016 12:24 PM  Sara Silva  has presented today for surgery, with the diagnosis of GERD/DIARRHEA  The various methods of treatment have been discussed with the patient and family. After consideration of risks, benefits and other options for treatment, the patient has consented to  Procedure(s) with comments: COLONOSCOPY WITH PROPOFOL (N/A) - 1245 ESOPHAGOGASTRODUODENOSCOPY (EGD) WITH PROPOFOL (N/A) as a surgical intervention .  The patient's history has been reviewed, patient examined, no change in status, stable for surgery.  I have reviewed the patient's chart and labs.  Questions were answered to the patient's satisfaction.     Sara Silva  No change. EGD colonoscopy per plan. The risks, benefits, limitations, imponderables and alternatives regarding both EGD and colonoscopy have been reviewed with the patient. Questions have been answered. All parties agreeable.

## 2016-01-30 NOTE — Anesthesia Postprocedure Evaluation (Signed)
Anesthesia Post Note  Patient: Sara Silva  Procedure(s) Performed: Procedure(s) (LRB): COLONOSCOPY WITH PROPOFOL (N/A) ESOPHAGOGASTRODUODENOSCOPY (EGD) WITH PROPOFOL (N/A) BIOPSY  Patient location during evaluation: PACU Anesthesia Type: MAC Level of consciousness: awake and alert and oriented Pain management: pain level controlled Respiratory status: spontaneous breathing and respiratory function stable Cardiovascular status: stable Postop Assessment: no signs of nausea or vomiting Anesthetic complications: no    Last Vitals:  Filed Vitals:   01/30/16 1144  BP: 136/70  Pulse: 64  Temp: 36.9 C  Resp: 18    Last Pain: There were no vitals filed for this visit.               Nikan Ellingson A

## 2016-01-30 NOTE — Anesthesia Preprocedure Evaluation (Addendum)
Anesthesia Evaluation  Patient identified by MRN, date of birth, ID band Patient awake    Reviewed: Allergy & Precautions, NPO status , Patient's Chart, lab work & pertinent test results  Airway Mallampati: II  TM Distance: >3 FB Neck ROM: Full    Dental  (+) Dental Advisory Given, Edentulous Upper, Edentulous Lower   Pulmonary COPD, former smoker,    Pulmonary exam normal        Cardiovascular + Peripheral Vascular Disease  negative cardio ROS Normal cardiovascular exam     Neuro/Psych Depression CVA    GI/Hepatic negative GI ROS, Neg liver ROS,   Endo/Other  negative endocrine ROSdiabetes  Renal/GU negative Renal ROS  negative genitourinary   Musculoskeletal negative musculoskeletal ROS (+)   Abdominal   Peds negative pediatric ROS (+)  Hematology negative hematology ROS (+)   Anesthesia Other Findings   Reproductive/Obstetrics negative OB ROS                            Anesthesia Physical Anesthesia Plan  ASA: III  Anesthesia Plan: MAC   Post-op Pain Management:    Induction: Intravenous  Airway Management Planned: Simple Face Mask  Additional Equipment:   Intra-op Plan:   Post-operative Plan:   Informed Consent: I have reviewed the patients History and Physical, chart, labs and discussed the procedure including the risks, benefits and alternatives for the proposed anesthesia with the patient or authorized representative who has indicated his/her understanding and acceptance.   Dental advisory given  Plan Discussed with: CRNA  Anesthesia Plan Comments:         Anesthesia Quick Evaluation

## 2016-01-30 NOTE — Transfer of Care (Signed)
Immediate Anesthesia Transfer of Care Note  Patient: Sara Silva  Procedure(s) Performed: Procedure(s) with comments: COLONOSCOPY WITH PROPOFOL (N/A) - 1245 ESOPHAGOGASTRODUODENOSCOPY (EGD) WITH PROPOFOL (N/A) BIOPSY  Patient Location: PACU  Anesthesia Type:MAC  Level of Consciousness: awake, alert , oriented and patient cooperative  Airway & Oxygen Therapy: Patient Spontanous Breathing and Patient connected to face mask oxygen  Post-op Assessment: Report given to RN and Post -op Vital signs reviewed and stable  Post vital signs: Reviewed and stable  Last Vitals:  Filed Vitals:   01/30/16 1144  BP: 136/70  Pulse: 64  Temp: 36.9 C  Resp: 18    Complications: No apparent anesthesia complications

## 2016-01-30 NOTE — Anesthesia Procedure Notes (Signed)
Procedure Name: MAC Date/Time: 01/30/2016 12:32 PM Performed by: Andree Elk, Lashaunda Schild A Pre-anesthesia Checklist: Patient identified, Emergency Drugs available, Suction available, Patient being monitored and Timeout performed Oxygen Delivery Method: Simple face mask

## 2016-01-30 NOTE — H&P (View-Only) (Signed)
Primary Care Physician:  Marjo Bicker, MD Primary Gastroenterologist:  Dr. Gala Romney  Pre-Procedure History & Physical: HPI:  Sara Silva is a 75 y.o. female here for you for reevaluation of her early satiety, refractory GERD and diarrhea. Patient seen here originally on 02/18/2015. These symptoms were to be worked up with a diagnostic EGD and colonoscopy. Because of insurance constraints, patient was unable to have them done at that time. She is here now and wants to proceed with the evaluation. She has lost 4 pounds and seen last year. She's not passed any blood. Denies dysphagia. She continues taking metformin. GI symptoms unchanged. Last colonoscopy good 11 years ago without significant findings. Last EGD 5 years ago.  She tried Prilosec previously and did not agree with her system. She also tried a course of Viberzi for diarrhea and it "locked her up". She has been taking Questran intermittently which also produced constipation.  Past Medical History  Diagnosis Date  . Diabetes mellitus   . High cholesterol   . Nervous breakdown     Past Surgical History  Procedure Laterality Date  . Hip surgery    . Esophagogastroduodenoscopy endoscopy  02/26/11    Dr.Shifflet- small hiatla hernia, normal stomach except antal gastritis. esophageal bx= benign squamous mucosa, antrum bx= mild chronic gastrophathy.   . Colonoscopy  10/16/2005    [place], [place]- no polyps or inflammatory disease, normal tcs.  . Cholecystectomy      Prior to Admission medications   Medication Sig Start Date End Date Taking? Authorizing Provider  clonazePAM (KLONOPIN) 0.5 MG tablet Take 0.5 mg by mouth 2 (two) times daily.   Yes Historical Provider, MD  FLUoxetine (PROZAC) 20 MG capsule Take 20 mg by mouth daily.   Yes Historical Provider, MD  lithium carbonate 300 MG capsule Take 300 mg by mouth 2 (two) times daily with a meal.   Yes Historical Provider, MD  metFORMIN (GLUCOPHAGE) 500 MG tablet Take 500 mg  by mouth 2 (two) times daily with a meal.   Yes Historical Provider, MD  QUEtiapine (SEROQUEL) 100 MG tablet Take 100 mg by mouth at bedtime.   Yes Historical Provider, MD  simvastatin (ZOCOR) 10 MG tablet Take 10 mg by mouth daily.   Yes Historical Provider, MD  aspirin 81 MG tablet Take 81 mg by mouth daily. Reported on 01/03/2016    Historical Provider, MD  cholestyramine Lucrezia Starch) 4 G packet Take 4 g by mouth daily. Reported on 01/03/2016    Historical Provider, MD  polyethylene glycol-electrolytes (NULYTELY/GOLYTELY) 420 G solution Take 4,000 mLs by mouth once. Patient not taking: Reported on 01/03/2016 02/21/15   Mahala Menghini, PA-C  polyethylene glycol-electrolytes (TRILYTE) 420 G solution Take 4,000 mLs by mouth as directed. Patient not taking: Reported on 01/03/2016 02/18/15   Daneil Dolin, MD    Allergies as of 01/03/2016 - Review Complete 01/03/2016  Allergen Reaction Noted  . Sulfonamide derivatives Hives     Family History  Problem Relation Age of Onset  . Colon cancer Neg Hx   . Ovarian cancer Sister     Social History   Social History  . Marital Status: Widowed    Spouse Name: N/A  . Number of Children: N/A  . Years of Education: N/A   Occupational History  . Not on file.   Social History Main Topics  . Smoking status: Current Some Day Smoker -- 1.00 packs/day    Types: Cigarettes  . Smokeless tobacco: Not on file  .  Alcohol Use: No  . Drug Use: No  . Sexual Activity: Not on file   Other Topics Concern  . Not on file   Social History Narrative    Review of Systems: See HPI, otherwise negative ROS  Physical Exam: BP 149/75 mmHg  Pulse 87  Temp(Src) 98.4 F (36.9 C)  Ht 5\' 2"  (1.575 m)  Wt 120 lb 9.6 oz (54.704 kg)  BMI 22.05 kg/m2 General:   Alert, small framed, elderly lady, pleasant and cooperative in NAD Skin:  Intact without significant lesions or rashes. Neck:  Supple; no masses or thyromegaly. No significant cervical adenopathy. Lungs:   Clear throughout to auscultation.   No wheezes, crackles, or rhonchi. No acute distress. Heart:  Regular rate and rhythm; no murmurs, clicks, rubs,  or gallops. Abdomen: Non-distended, normal bowel sounds.  Soft and nontender without appreciable mass or hepatosplenomegaly.  Pulses:  Normal pulses noted. Extremities:  Without clubbing or edema.  Impression:  Very pleasant 75 year old lady with chronic, refractory GERD, early satiety, weight loss along with chronic diarrhea. Further evaluation recommended as outlined previously. Patient is now able to undergo the evaluation. The possibility of microscopic colitis and medication effect( metformin) needs to be considered along with other entities as a cause for diarrhea. To this end, I have recommended a diagnostic EGD and colonoscopy.  Intolerance to Prilosec noted. The risks, benefits, limitations, imponderables and alternatives regarding both EGD and colonoscopy have been reviewed with the patient. Questions have been answered. All parties agreeable.    Recommendations:  Schedule Diagnostic EGD and Colonoscopy (GERD and diarrhea) - pediatric colonoscope with deep sedation  given co-morbidities and chronic medications.  Further recommendations to follow pending review of endoscopy findings..    Notice: This dictation was prepared with Dragon dictation along with smaller phrase technology. Any transcriptional errors that result from this process are unintentional and may not be corrected upon review.

## 2016-01-30 NOTE — Discharge Instructions (Signed)
Colonoscopy Discharge Instructions  Read the instructions outlined below and refer to this sheet in the next few weeks. These discharge instructions provide you with general information on caring for yourself after you leave the hospital. Your doctor may also give you specific instructions. While your treatment has been planned according to the most current medical practices available, unavoidable complications occasionally occur. If you have any problems or questions after discharge, call Dr. Gala Romney at [phone]. ACTIVITY  You may resume your regular activity, but move at a slower pace for the next 24 hours.   Take frequent rest periods for the next 24 hours.   Walking will help get rid of the air and reduce the bloated feeling in your belly (abdomen).   No driving for 24 hours (because of the medicine (anesthesia) used during the test).    Do not sign any important legal documents or operate any machinery for 24 hours (because of the anesthesia used during the test).  NUTRITION  Drink plenty of fluids.   You may resume your normal diet as instructed by your doctor.   Begin with a light meal and progress to your normal diet. Heavy or fried foods are harder to digest and may make you feel sick to your stomach (nauseated).   Avoid alcoholic beverages for 24 hours or as instructed.  MEDICATIONS  You may resume your normal medications unless your doctor tells you otherwise.  WHAT YOU CAN EXPECT TODAY  Some feelings of bloating in the abdomen.   Passage of more gas than usual.   Spotting of blood in your stool or on the toilet paper.  IF YOU HAD POLYPS REMOVED DURING THE COLONOSCOPY:  No aspirin products for 7 days or as instructed.   No alcohol for 7 days or as instructed.   Eat a soft diet for the next 24 hours.  FINDING OUT THE RESULTS OF YOUR TEST Not all test results are available during your visit. If your test results are not back during the visit, make an appointment  with your caregiver to find out the results. Do not assume everything is normal if you have not heard from your caregiver or the medical facility. It is important for you to follow up on all of your test results.  SEEK IMMEDIATE MEDICAL ATTENTION IF:  You have more than a spotting of blood in your stool.   Your belly is swollen (abdominal distention).   You are nauseated or vomiting.   You have a temperature over 101.  You have abdominal pain or discomfort that is severe or gets worse throughout the day. EGD Discharge instructions Please read the instructions outlined below and refer to this sheet in the next few weeks. These discharge instructions provide you with general information on caring for yourself after you leave the hospital. Your doctor may also give you specific instructions. While your treatment has been planned according to the most current medical practices available, unavoidable complications occasionally occur. If you have any problems or questions after discharge, please call your doctor. ACTIVITY You may resume your regular activity but move at a slower pace for the next 24 hours.  Take frequent rest periods for the next 24 hours.  Walking will help expel (get rid of) the air and reduce the bloated feeling in your abdomen.  No driving for 24 hours (because of the anesthesia (medicine) used during the test).  You may shower.  Do not sign any important legal documents or operate any machinery for 24  hours (because of the anesthesia used during the test).  NUTRITION Drink plenty of fluids.  You may resume your normal diet.  Begin with a light meal and progress to your normal diet.  Avoid alcoholic beverages for 24 hours or as instructed by your caregiver.  MEDICATIONS You may resume your normal medications unless your caregiver tells you otherwise.  WHAT YOU CAN EXPECT TODAY You may experience abdominal discomfort such as a feeling of fullness or gas pains.   FOLLOW-UP Your doctor will discuss the results of your test with you.  SEEK IMMEDIATE MEDICAL ATTENTION IF ANY OF THE FOLLOWING OCCUR: Excessive nausea (feeling sick to your stomach) and/or vomiting.  Severe abdominal pain and distention (swelling).  Trouble swallowing.  Temperature over 101 F (37.8 C).  Rectal bleeding or vomiting of blood.     GERD and diverticulosis information provided  Begin Dexilant 60 mg daily for GERD - go by my office for free samples.  Further recommendations to follow pending review of pathology report  Diverticulosis Diverticulosis is the condition that develops when small pouches (diverticula) form in the wall of your colon. Your colon, or large intestine, is where water is absorbed and stool is formed. The pouches form when the inside layer of your colon pushes through weak spots in the outer layers of your colon. CAUSES  No one knows exactly what causes diverticulosis. RISK FACTORS  Being older than 62. Your risk for this condition increases with age. Diverticulosis is rare in people younger than 40 years. By age 46, almost everyone has it.  Eating a low-fiber diet.  Being frequently constipated.  Being overweight.  Not getting enough exercise.  Smoking.  Taking over-the-counter pain medicines, like aspirin and ibuprofen. SYMPTOMS  Most people with diverticulosis do not have symptoms. DIAGNOSIS  Because diverticulosis often has no symptoms, health care providers often discover the condition during an exam for other colon problems. In many cases, a health care provider will diagnose diverticulosis while using a flexible scope to examine the colon (colonoscopy). TREATMENT  If you have never developed an infection related to diverticulosis, you may not need treatment. If you have had an infection before, treatment may include:  Eating more fruits, vegetables, and grains.  Taking a fiber supplement.  Taking a live bacteria supplement  (probiotic).  Taking medicine to relax your colon. HOME CARE INSTRUCTIONS   Drink at least 6-8 glasses of water each day to prevent constipation.  Try not to strain when you have a bowel movement.  Keep all follow-up appointments. If you have had an infection before:  Increase the fiber in your diet as directed by your health care provider or dietitian.  Take a dietary fiber supplement if your health care provider approves.  Only take medicines as directed by your health care provider. SEEK MEDICAL CARE IF:   You have abdominal pain.  You have bloating.  You have cramps.  You have not gone to the bathroom in 3 days. SEEK IMMEDIATE MEDICAL CARE IF:   Your pain gets worse.  Yourbloating becomes very bad.  You have a fever or chills, and your symptoms suddenly get worse.  You begin vomiting.  You have bowel movements that are bloody or black. MAKE SURE YOU:  Understand these instructions.  Will watch your condition.  Will get help right away if you are not doing well or get worse.   This information is not intended to replace advice given to you by your health care provider. Make sure  you discuss any questions you have with your health care provider.   Document Released: 08/16/2004 Document Revised: 11/24/2013 Document Reviewed: 10/14/2013 Elsevier Interactive Patient Education 2016 [place].   Gastroesophageal Reflux Disease, Adult Normally, food travels down the esophagus and stays in the stomach to be digested. However, when a person has gastroesophageal reflux disease (GERD), food and stomach acid move back up into the esophagus. When this happens, the esophagus becomes sore and inflamed. Over time, GERD can create small holes (ulcers) in the lining of the esophagus.  CAUSES This condition is caused by a problem with the muscle between the esophagus and the stomach (lower esophageal sphincter, or LES). Normally, the LES muscle closes after food passes  through the esophagus to the stomach. When the LES is weakened or abnormal, it does not close properly, and that allows food and stomach acid to go back up into the esophagus. The LES can be weakened by certain dietary substances, medicines, and medical conditions, including:  Tobacco use.  Pregnancy.  Having a hiatal hernia.  Heavy alcohol use.  Certain foods and beverages, such as coffee, chocolate, onions, and peppermint. RISK FACTORS This condition is more likely to develop in:  People who have an increased body weight.  People who have connective tissue disorders.  People who use NSAID medicines. SYMPTOMS Symptoms of this condition include:  Heartburn.  Difficult or painful swallowing.  The feeling of having a lump in the throat.  Abitter taste in the mouth.  Bad breath.  Having a large amount of saliva.  Having an upset or bloated stomach.  Belching.  Chest pain.  Shortness of breath or wheezing.  Ongoing (chronic) cough or a night-time cough.  Wearing away of tooth enamel.  Weight loss. Different conditions can cause chest pain. Make sure to see your health care provider if you experience chest pain. DIAGNOSIS Your health care provider will take a medical history and perform a physical exam. To determine if you have mild or severe GERD, your health care provider may also monitor how you respond to treatment. You may also have other tests, including:  An endoscopy toexamine your stomach and esophagus with a small camera.  A test thatmeasures the acidity level in your esophagus.  A test thatmeasures how much pressure is on your esophagus.  A barium swallow or modified barium swallow to show the shape, size, and functioning of your esophagus. TREATMENT The goal of treatment is to help relieve your symptoms and to prevent complications. Treatment for this condition may vary depending on how severe your symptoms are. Your health care provider may  recommend:  Changes to your diet.  Medicine.  Surgery. HOME CARE INSTRUCTIONS Diet  Follow a diet as recommended by your health care provider. This may involve avoiding foods and drinks such as:  Coffee and tea (with or without caffeine).  Drinks that containalcohol.  Energy drinks and sports drinks.  Carbonated drinks or sodas.  Chocolate and cocoa.  Peppermint and mint flavorings.  Garlic and onions.  Horseradish.  Spicy and acidic foods, including peppers, chili powder, curry powder, vinegar, hot sauces, and barbecue sauce.  Citrus fruit juices and citrus fruits, such as oranges, lemons, and limes.  Tomato-based foods, such as red sauce, chili, salsa, and pizza with red sauce.  Fried and fatty foods, such as donuts, french fries, potato chips, and high-fat dressings.  High-fat meats, such as hot dogs and fatty cuts of red and white meats, such as rib eye steak, sausage, ham,  and bacon.  High-fat dairy items, such as whole milk, butter, and cream cheese.  Eat small, frequent meals instead of large meals.  Avoid drinking large amounts of liquid with your meals.  Avoid eating meals during the 2-3 hours before bedtime.  Avoid lying down right after you eat.  Do not exercise right after you eat. General Instructions  Pay attention to any changes in your symptoms.  Take over-the-counter and prescription medicines only as told by your health care provider. Do not take aspirin, ibuprofen, or other NSAIDs unless your health care provider told you to do so.  Do not use any tobacco products, including cigarettes, chewing tobacco, and e-cigarettes. If you need help quitting, ask your health care provider.  Wear loose-fitting clothing. Do not wear anything tight around your waist that causes pressure on your abdomen.  Raise (elevate) the head of your bed 6 inches (15cm).  Try to reduce your stress, such as with yoga or meditation. If you need help reducing  stress, ask your health care provider.  If you are overweight, reduce your weight to an amount that is healthy for you. Ask your health care provider for guidance about a safe weight loss goal.  Keep all follow-up visits as told by your health care provider. This is important. SEEK MEDICAL CARE IF:  You have new symptoms.  You have unexplained weight loss.  You have difficulty swallowing, or it hurts to swallow.  You have wheezing or a persistent cough.  Your symptoms do not improve with treatment.  You have a hoarse voice. SEEK IMMEDIATE MEDICAL CARE IF:  You have pain in your arms, neck, jaw, teeth, or back.  You feel sweaty, dizzy, or light-headed.  You have chest pain or shortness of breath.  You vomit and your vomit looks like blood or coffee grounds.  You faint.  Your stool is bloody or black.  You cannot swallow, drink, or eat.   This information is not intended to replace advice given to you by your health care provider. Make sure you discuss any questions you have with your health care provider.   Document Released: 08/29/2005 Document Revised: 08/10/2015 Document Reviewed: 03/16/2015 Elsevier Interactive Patient Education [employer].

## 2016-01-30 NOTE — Op Note (Signed)
[facility] [address] [place], [address]   COLONOSCOPY PROCEDURE REPORT  PATIENT: Sara Silva, Sara Silva  MR#: [id number] BIRTHDATE: 22-Aug-1941 , 42  yrs. old GENDER: female ENDOSCOPIST: R.  Garfield Cornea, MD [facility] REFERRED BY:  Dr. Dorthea Cove PROCEDURE DATE:  02-23-2016 PROCEDURE:   Colonoscopy with biopsy INDICATIONS:Chronic diarrhea. MEDICATIONS: Deep sedation per Dr.  [facility] ASA CLASS:       Class II  CONSENT: The risks, benefits, alternatives and imponderables including but not limited to bleeding, perforation as well as the possibility of a missed lesion have been reviewed.  The potential for biopsy, lesion removal, etc. have also been discussed. Questions have been answered.  All parties agreeable.  Please see the history and physical in the medical record for more information.  DESCRIPTION OF PROCEDURE:   After the risks benefits and alternatives of the procedure were thoroughly explained, informed consent was obtained.  The digital rectal exam revealed no abnormalities of the rectum.   The [id number]  endoscope was introduced through the anus and advanced to the cecum, which was identified by both the appendix and ileocecal valve. No adverse events experienced.   The quality of the prep was adequate  The instrument was then slowly withdrawn as the colon was fully examined. Estimated blood loss is zero unless otherwise noted in this procedure report.      COLON FINDINGS: Normal-appearing rectal mucosa.  Redundant colon. Scattered left-sided diverticula; the remainder of the colonic mucosa appeared normal.  Some change in the patient's position and external abdominal pressure required to reach the cecum.  Segmental biopsies of the ascending as well as descending/sigmoid taken.  Retroflexion was performed. .  Withdrawal time=10 minutes 0 seconds.  The scope was withdrawn and the procedure completed. COMPLICATIONS: There  were no immediate complications. EBL 3 mL ENDOSCOPIC IMPRESSION: Colonic diverticulosis. Redundant colon. Status post biopsy  RECOMMENDATIONS: Follow-up on pathology. See EGD report.  eSigned:  R. Garfield Cornea, MD Rosalita Chessman [facility] Feb 23, 2016 1:28 PM   cc:  CPT CODES: ICD CODES:  The ICD and CPT codes recommended by this software are interpretations from the data that the clinical staff has captured with the software.  The verification of the translation of this report to the ICD and CPT codes and modifiers is the sole responsibility of the health care institution and practicing physician where this report was generated.  [employer] will not be held responsible for the validity of the ICD and CPT codes included on this report.  AMA assumes no liability for data contained or not contained herein. CPT is a Designer, television/film set of the [employer].  PATIENT NAME:  Sara Silva, Sara Silva MR#: [id number]

## 2016-02-01 ENCOUNTER — Encounter (HOSPITAL_COMMUNITY): Payer: Self-pay | Admitting: Internal Medicine

## 2016-02-02 ENCOUNTER — Encounter: Payer: Self-pay | Admitting: Internal Medicine

## 2016-02-03 ENCOUNTER — Telehealth: Payer: Self-pay

## 2016-02-03 ENCOUNTER — Encounter: Payer: Self-pay | Admitting: Internal Medicine

## 2016-02-03 NOTE — Telephone Encounter (Signed)
Letter mailed to the pt. 

## 2016-02-03 NOTE — Telephone Encounter (Signed)
APPT MADE AND LETTER SENT  °

## 2016-02-03 NOTE — Telephone Encounter (Signed)
Per RMR- Send letter to patient.  Send copy of letter with path to referring provider and PCP.  Patient needs a follow-up appointment with extender in the next couple weeks to expedite reassessment of diarrhea. I think the metformin probably needs to be stopped

## 2016-02-27 ENCOUNTER — Ambulatory Visit: Payer: Medicare Other | Admitting: Gastroenterology

## 2016-02-27 ENCOUNTER — Telehealth: Payer: Self-pay | Admitting: Gastroenterology

## 2016-02-27 ENCOUNTER — Encounter: Payer: Self-pay | Admitting: Gastroenterology

## 2016-02-27 NOTE — Telephone Encounter (Signed)
PATIENT WAS A NO SHOW AND LETTER SENT  °

## 2016-03-23 ENCOUNTER — Ambulatory Visit (INDEPENDENT_AMBULATORY_CARE_PROVIDER_SITE_OTHER): Payer: Medicare Other | Admitting: Gastroenterology

## 2016-03-23 ENCOUNTER — Encounter: Payer: Self-pay | Admitting: Gastroenterology

## 2016-03-23 VITALS — BP 129/74 | HR 82 | Temp 98.4°F | Ht 62.0 in | Wt 120.2 lb

## 2016-03-23 DIAGNOSIS — K5900 Constipation, unspecified: Secondary | ICD-10-CM

## 2016-03-23 DIAGNOSIS — K2100 Gastro-esophageal reflux disease with esophagitis, without bleeding: Secondary | ICD-10-CM

## 2016-03-23 DIAGNOSIS — K921 Melena: Secondary | ICD-10-CM | POA: Diagnosis not present

## 2016-03-23 DIAGNOSIS — K529 Noninfective gastroenteritis and colitis, unspecified: Secondary | ICD-10-CM

## 2016-03-23 MED ORDER — OMEPRAZOLE 20 MG PO CPDR: 20.0000 mg | DELAYED_RELEASE_CAPSULE | Freq: Every day | ORAL | Status: AC

## 2016-03-23 NOTE — Assessment & Plan Note (Signed)
Recommend PPI therapy at least for 3 months and longer if she has symptomatic reflux. States that she did not like omeprazole because it cost her $24 a month and she cannot afford it. We will see how much it is through her mail order which she believes would be free. Omeprazole is the best covered PPI on her formulary. She will let us know she has any issues obtaining medication. Discussed antireflux measures.

## 2016-03-23 NOTE — Assessment & Plan Note (Signed)
?   Melena. Check CBC.

## 2016-03-23 NOTE — Assessment & Plan Note (Signed)
Several year history of altered bowel function. Goes from diarrhea to constipation. Has been on metformin for decades. As per Dr. Roseanne Kaufman plan, have encouraged patient to stop metformin for a couple of weeks at least to see if her bowel function improves. She does not want to change her diabetes medicines because of the price. She is willing to consider holding at least for a couple weeks. In the meantime we will check her last hemoglobin A1c results. Encouraged her to discuss with her PCP as well. Further recommendations to follow.

## 2016-03-23 NOTE — Patient Instructions (Signed)
1. I sent in RX to [facility] for omeprazole #90. It appears to be the best covered medication in this class. Let me know if you have any issues with the price. 2. I will retrieve last diabetes blood work for review. 3. We will check labs for anemia.  4. Consider stopping metformin for couple of weeks to see if your bowel issues improve.

## 2016-03-23 NOTE — Progress Notes (Signed)
Primary Care Physician: Marjo Bicker, MD  Primary Gastroenterologist:  Garfield Cornea, MD   Chief Complaint  Patient presents with  . Diarrhea    back and forth to constipation    HPI: Sara Silva is a 75 y.o. female here For follow-up of early satiety, refractory GERD, diarrhea. Underwent EGD and colonoscopy in February. She had erosive reflux esophagitis, reactive gastropathy, diverticulosis, benign random colon biopsies. Given a few samples of Dexilant but wasn't sure if it was helping because it was a limited supply. States omeprazole was too expensive, $24 a month. Wonders about using [facility] mail order for which she gets free medications for using.  Continues to have some lower abdominal discomfort, fecal urgency with loose stools. Half the time she feels like she has difficulty going to the bathroom though. Sometimes the stools are hard. Stools have been very dark,? Black. Symptoms since at least 2013 when she had her gallbladder out. She has been on metformin for over 3 decades. She is only on 500 mg daily. She has no idea what her last hemoglobin A1c was. She is reluctant to switch and diabetes medicines due to price. Denies dysphagia. Appetite is good.    Current Outpatient Prescriptions  Medication Sig Dispense Refill  . amoxicillin-clavulanate (AUGMENTIN) 875-125 MG tablet Take 1 tablet by mouth 2 (two) times daily. Reported on 03/23/2016    . aspirin EC 81 MG tablet Take 81 mg by mouth daily.    . clonazePAM (KLONOPIN) 0.5 MG tablet Take 0.5 mg by mouth 2 (two) times daily.    Marland Kitchen FLUoxetine (PROZAC) 20 MG capsule Take 20 mg by mouth daily.    Marland Kitchen lithium carbonate 300 MG capsule Take 300 mg by mouth at bedtime.     . metFORMIN (GLUCOPHAGE) 500 MG tablet Take 500 mg by mouth at bedtime.     Marland Kitchen QUEtiapine (SEROQUEL) 100 MG tablet Take 100 mg by mouth at bedtime.    . simvastatin (ZOCOR) 10 MG tablet Take 10 mg by mouth daily.     No current facility-administered  medications for this visit.    Allergies as of 03/23/2016 - Review Complete 03/23/2016  Allergen Reaction Noted  . Sulfonamide derivatives Hives     ROS:  General: Negative for anorexia, weight loss, fever, chills, fatigue, weakness. ENT: Negative for hoarseness, difficulty swallowing , nasal congestion. CV: Negative for chest pain, angina, palpitations, dyspnea on exertion, peripheral edema.  Respiratory: Negative for dyspnea at rest, dyspnea on exertion, cough, sputum, wheezing.  GI: See history of present illness. GU:  Negative for dysuria, hematuria, urinary incontinence, urinary frequency, nocturnal urination.  Endo: Negative for unusual weight change.    Physical Examination:   BP 129/74 mmHg  Pulse 82  Temp(Src) 98.4 F (36.9 C) (Oral)  Ht 5\' 2"  (1.575 m)  Wt 120 lb 3.2 oz (54.522 kg)  BMI 21.98 kg/m2  General: Well-nourished, well-developed in no acute distress.  Eyes: No icterus. Mouth: Oropharyngeal mucosa moist and pink , no lesions erythema or exudate. Lungs: Clear to auscultation bilaterally.  Heart: Regular rate and rhythm, no murmurs rubs or gallops.  Abdomen: Bowel sounds are normal, nontender, nondistended, no hepatosplenomegaly or masses, no abdominal bruits or hernia , no rebound or guarding.   Extremities: No lower extremity edema. No clubbing or deformities. Neuro: Alert and oriented x 4   Skin: Warm and dry, no jaundice.   Psych: Alert and cooperative, normal mood and affect.  Labs:  Lab Results  Component  Value Date   CREATININE 0.66 01/26/2016   BUN 16 01/26/2016   NA 137 01/26/2016   K 4.5 01/26/2016   CL 105 01/26/2016   CO2 26 01/26/2016   Lab Results  Component Value Date   ALT 12 01/05/2015   AST 15 01/05/2015   ALKPHOS 64 01/05/2015   BILITOT 0.3 01/05/2015   Lab Results  Component Value Date   WBC 8.7 01/26/2016   HGB 13.1 01/26/2016   HCT 40.2 01/26/2016   MCV 99.5 01/26/2016   PLT 340 01/26/2016     Imaging Studies: No  results found.

## 2016-03-26 NOTE — Progress Notes (Signed)
cc'ed to pcp °

## 2016-04-06 NOTE — Progress Notes (Signed)
Received labs from April 2016 from [facility] blood cell count 6250, hemoglobin 13.1, hematocrit 40, platelets 248,000, BUN 19, creatinine 0.92, glucose 87, total bilirubin 0.4, alkaline phosphatase 71, AST 16, ALT 21, albumin 3.8, hemoglobin A1c 5.4, TSH 2.0  Please let patient know her last HgbA1C was in the "normal" range although it was from last year. I suspect her DM is very mild. I would encourage her to hold metformin for few of weeks to see how her diarrhea/bowel issues are before considering any further testing.   Return to see Dr. Gala Romney in four weeks.

## 2016-04-17 NOTE — Progress Notes (Signed)
Pt is aware. She said she has specific dates that she can come in and would like someone to call her before scheduling her appt.

## 2016-04-18 NOTE — Progress Notes (Signed)
APPT MADE AND PATIENT CALLED

## 2016-05-18 ENCOUNTER — Ambulatory Visit: Payer: Medicare Other | Admitting: Internal Medicine
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
## 2017-09-17 ENCOUNTER — Encounter (HOSPITAL_COMMUNITY): Payer: Self-pay | Admitting: Psychiatry

## 2017-09-17 ENCOUNTER — Ambulatory Visit (INDEPENDENT_AMBULATORY_CARE_PROVIDER_SITE_OTHER): Payer: Medicare Other | Admitting: Psychiatry

## 2017-09-17 VITALS — BP 129/70 | HR 93 | Ht 62.0 in | Wt 105.0 lb

## 2017-09-17 DIAGNOSIS — R450 Nervousness: Secondary | ICD-10-CM | POA: Diagnosis not present

## 2017-09-17 DIAGNOSIS — F419 Anxiety disorder, unspecified: Secondary | ICD-10-CM | POA: Diagnosis not present

## 2017-09-17 DIAGNOSIS — F3132 Bipolar disorder, current episode depressed, moderate: Secondary | ICD-10-CM | POA: Diagnosis not present

## 2017-09-17 DIAGNOSIS — R197 Diarrhea, unspecified: Secondary | ICD-10-CM

## 2017-09-17 DIAGNOSIS — G4700 Insomnia, unspecified: Secondary | ICD-10-CM

## 2017-09-17 DIAGNOSIS — R5383 Other fatigue: Secondary | ICD-10-CM | POA: Diagnosis not present

## 2017-09-17 DIAGNOSIS — Z636 Dependent relative needing care at home: Secondary | ICD-10-CM

## 2017-09-17 DIAGNOSIS — K219 Gastro-esophageal reflux disease without esophagitis: Secondary | ICD-10-CM

## 2017-09-17 DIAGNOSIS — F39 Unspecified mood [affective] disorder: Secondary | ICD-10-CM

## 2017-09-17 DIAGNOSIS — R454 Irritability and anger: Secondary | ICD-10-CM | POA: Diagnosis not present

## 2017-09-17 DIAGNOSIS — F17210 Nicotine dependence, cigarettes, uncomplicated: Secondary | ICD-10-CM

## 2017-09-17 MED ORDER — LITHIUM CARBONATE 300 MG PO CAPS: 300.0000 mg | ORAL_CAPSULE | Freq: Every day | ORAL | 1 refills | Status: DC

## 2017-09-17 MED ORDER — QUETIAPINE FUMARATE 100 MG PO TABS: 100.0000 mg | ORAL_TABLET | Freq: Every day | ORAL | 1 refills | Status: DC

## 2017-09-17 NOTE — Patient Instructions (Signed)
1. Continue lithium 300 mg daily  2. Continue quetiapine 100 mg at night 3. Continue clonazepam 0.5 mg daily as needed for anxiety 4. Return to clinic in one month for 30 mins

## 2017-09-23 ENCOUNTER — Telehealth (HOSPITAL_COMMUNITY): Payer: Self-pay | Admitting: *Deleted

## 2017-09-23 ENCOUNTER — Other Ambulatory Visit (HOSPITAL_COMMUNITY): Payer: Self-pay | Admitting: Psychiatry

## 2017-09-23 MED ORDER — LITHIUM CARBONATE 300 MG PO CAPS: 300.0000 mg | ORAL_CAPSULE | Freq: Every day | ORAL | 0 refills | Status: DC

## 2017-09-23 MED ORDER — QUETIAPINE FUMARATE 100 MG PO TABS: 100.0000 mg | ORAL_TABLET | Freq: Every day | ORAL | 0 refills | Status: DC

## 2017-09-23 NOTE — Telephone Encounter (Signed)
Sophia with [facility] called wanting clarifications for pt Lithium 300 mg. Per Jillyn Ledger, pt has a history of being on the Tablets and she noticed the script they received is for Capsules. Per Jillyn Ledger, she would like if provider wants to keep Lithium as a Capsule or change it back to Tablets. Also Sophia stated provider did not give pt enough quantity for the month. Per Sophia they are a mail order and they do 90 days at a time and provider sent in 30 days. Per Sophia the ref number is [id number]. [facility] number is [phone].

## 2017-09-23 NOTE — Telephone Encounter (Signed)
Called [facility] and spoke with [facility] and she stated there's no Bicarbonate but there's a carbonate. Per Lonn Georgia, they would like to know if it's suppose to be ER or regular release. Ref number is [id number] and phone number is [phone].

## 2017-09-23 NOTE — Telephone Encounter (Signed)
Ordered meds for 90 days. Is the tablet they are referring to is lithium extended release or lithium bicarbonate? We would like to keep the same formula which she has been taking.

## 2017-09-23 NOTE — Telephone Encounter (Signed)
Is the formula patient has been taking ER or regular release? Would like to continue the same one as it used to be prescribed by other providers in the past.

## 2017-09-25 ENCOUNTER — Other Ambulatory Visit (HOSPITAL_COMMUNITY): Payer: Self-pay | Admitting: Psychiatry

## 2017-09-25 MED ORDER — LITHIUM CARBONATE 300 MG PO TABS: 300.0000 mg | ORAL_TABLET | Freq: Every day | ORAL | 0 refills | Status: DC

## 2017-09-25 NOTE — Telephone Encounter (Signed)
[facility] called again trying to get clarifications about pt medications again and staff transferred call to provider for provider to answer their questions.
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
## 2017-10-16 ENCOUNTER — Encounter (HOSPITAL_COMMUNITY): Payer: Self-pay | Admitting: Psychiatry

## 2017-10-16 ENCOUNTER — Ambulatory Visit (INDEPENDENT_AMBULATORY_CARE_PROVIDER_SITE_OTHER): Payer: Medicare Other | Admitting: Psychiatry

## 2017-10-16 VITALS — BP 124/84 | HR 78 | Ht 62.0 in | Wt 101.0 lb

## 2017-10-16 DIAGNOSIS — R450 Nervousness: Secondary | ICD-10-CM

## 2017-10-16 DIAGNOSIS — F17210 Nicotine dependence, cigarettes, uncomplicated: Secondary | ICD-10-CM | POA: Diagnosis not present

## 2017-10-16 DIAGNOSIS — G4700 Insomnia, unspecified: Secondary | ICD-10-CM

## 2017-10-16 DIAGNOSIS — K219 Gastro-esophageal reflux disease without esophagitis: Secondary | ICD-10-CM

## 2017-10-16 DIAGNOSIS — G3184 Mild cognitive impairment, so stated: Secondary | ICD-10-CM | POA: Diagnosis not present

## 2017-10-16 DIAGNOSIS — F419 Anxiety disorder, unspecified: Secondary | ICD-10-CM

## 2017-10-16 DIAGNOSIS — R451 Restlessness and agitation: Secondary | ICD-10-CM

## 2017-10-16 DIAGNOSIS — R413 Other amnesia: Secondary | ICD-10-CM | POA: Diagnosis not present

## 2017-10-16 DIAGNOSIS — F0630 Mood disorder due to known physiological condition, unspecified: Secondary | ICD-10-CM

## 2017-10-16 NOTE — Patient Instructions (Signed)
1. Continue lithium 300 mg daily  2. Continue quetiapine 100 mg a night 3. Continue clonazepam 0.5 mg daily as needed for anxiety  4. Return to clinic in one month for 30 mins
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
## 2017-11-13 ENCOUNTER — Encounter (HOSPITAL_COMMUNITY): Payer: Self-pay | Admitting: Psychiatry

## 2017-11-13 ENCOUNTER — Ambulatory Visit (INDEPENDENT_AMBULATORY_CARE_PROVIDER_SITE_OTHER): Payer: Medicare Other | Admitting: Psychiatry

## 2017-11-13 VITALS — BP 118/78 | HR 103 | Ht 62.0 in | Wt 103.0 lb

## 2017-11-13 DIAGNOSIS — F0630 Mood disorder due to known physiological condition, unspecified: Secondary | ICD-10-CM | POA: Diagnosis not present

## 2017-11-13 DIAGNOSIS — F419 Anxiety disorder, unspecified: Secondary | ICD-10-CM | POA: Diagnosis not present

## 2017-11-13 DIAGNOSIS — R450 Nervousness: Secondary | ICD-10-CM

## 2017-11-13 DIAGNOSIS — K219 Gastro-esophageal reflux disease without esophagitis: Secondary | ICD-10-CM

## 2017-11-13 DIAGNOSIS — G3184 Mild cognitive impairment, so stated: Secondary | ICD-10-CM | POA: Insufficient documentation

## 2017-11-13 DIAGNOSIS — F17210 Nicotine dependence, cigarettes, uncomplicated: Secondary | ICD-10-CM

## 2017-11-13 DIAGNOSIS — G4700 Insomnia, unspecified: Secondary | ICD-10-CM

## 2017-11-13 MED ORDER — QUETIAPINE FUMARATE 100 MG PO TABS: 100.0000 mg | ORAL_TABLET | Freq: Every day | ORAL | 0 refills | Status: DC

## 2017-11-13 MED ORDER — LITHIUM CARBONATE 300 MG PO TABS: 300.0000 mg | ORAL_TABLET | Freq: Every day | ORAL | 0 refills | Status: DC

## 2017-11-13 MED ORDER — CLONAZEPAM 0.5 MG PO TABS: 0.5000 mg | ORAL_TABLET | Freq: Every day | ORAL | 2 refills | Status: DC | PRN

## 2017-11-13 NOTE — Patient Instructions (Signed)
1. Continue lithium 300 mg daily  (Labs: TSH 3.26, fT4 1.3, Creat 0.8, Lithium 0.18 07/16/2017) 2. Continue quetiapine 100 mg a night 3. Continue clonazepam 0.5 mg daily as needed for anxiety  4. Return to clinic in three months for 30 mins

## 2017-11-14 ENCOUNTER — Ambulatory Visit (HOSPITAL_COMMUNITY): Payer: Medicare Other | Admitting: Psychiatry

## 2017-11-14 ENCOUNTER — Ambulatory Visit (HOSPITAL_COMMUNITY): Payer: Self-pay | Admitting: Psychiatry
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
## 2018-02-11 ENCOUNTER — Encounter (HOSPITAL_COMMUNITY): Payer: Self-pay | Admitting: Psychiatry

## 2018-02-11 ENCOUNTER — Ambulatory Visit (INDEPENDENT_AMBULATORY_CARE_PROVIDER_SITE_OTHER): Payer: Medicare Other | Admitting: Psychiatry

## 2018-02-11 VITALS — BP 132/74 | HR 94 | Ht 62.0 in | Wt 102.0 lb

## 2018-02-11 DIAGNOSIS — F419 Anxiety disorder, unspecified: Secondary | ICD-10-CM | POA: Diagnosis not present

## 2018-02-11 DIAGNOSIS — G4700 Insomnia, unspecified: Secondary | ICD-10-CM | POA: Diagnosis not present

## 2018-02-11 DIAGNOSIS — F17210 Nicotine dependence, cigarettes, uncomplicated: Secondary | ICD-10-CM | POA: Diagnosis not present

## 2018-02-11 DIAGNOSIS — G3184 Mild cognitive impairment, so stated: Secondary | ICD-10-CM

## 2018-02-11 DIAGNOSIS — F0630 Mood disorder due to known physiological condition, unspecified: Secondary | ICD-10-CM | POA: Diagnosis not present

## 2018-02-11 DIAGNOSIS — R450 Nervousness: Secondary | ICD-10-CM

## 2018-02-11 MED ORDER — CLONAZEPAM 0.5 MG PO TABS: 0.5000 mg | ORAL_TABLET | Freq: Every day | ORAL | 2 refills | Status: DC | PRN

## 2018-02-11 MED ORDER — LITHIUM CARBONATE 300 MG PO TABS: 300.0000 mg | ORAL_TABLET | Freq: Every day | ORAL | 2 refills | Status: DC

## 2018-02-11 MED ORDER — QUETIAPINE FUMARATE 100 MG PO TABS: 100.0000 mg | ORAL_TABLET | Freq: Every day | ORAL | 0 refills | Status: DC

## 2018-02-11 NOTE — Patient Instructions (Signed)
1. Continue lithium 300 mg daily 2. Continue quetiapine 100 mg a night 3. Continue clonazepam 0.5 mg daily as needed for anxiety  4.Return to clinic in three months for 30 mins  5. Obtain blood test (lithium, BMP)

## 2018-02-12 LAB — BASIC METABOLIC PANEL
BUN: 18 mg/dL (ref 7–25)
CHLORIDE: 106 mmol/L (ref 98–110)
CO2: 29 mmol/L (ref 20–32)
Calcium: 9.7 mg/dL (ref 8.6–10.4)
Creat: 0.79 mg/dL (ref 0.60–0.93)
Glucose, Bld: 84 mg/dL (ref 65–139)
POTASSIUM: 4.5 mmol/L (ref 3.5–5.3)
Sodium: 141 mmol/L (ref 135–146)

## 2018-02-12 LAB — LITHIUM LEVEL: LITHIUM LVL: 0.4 mmol/L — AB (ref 0.6–1.2)

## 2018-02-28 ENCOUNTER — Encounter (HOSPITAL_COMMUNITY): Payer: Self-pay | Admitting: Psychiatry

## 2018-02-28 ENCOUNTER — Telehealth (HOSPITAL_COMMUNITY): Payer: Self-pay | Admitting: *Deleted

## 2018-02-28 NOTE — Telephone Encounter (Signed)
The patient does not need blood work this time as she already took it two weeks ago. It would be great if her daughter could come to the next follow up appointment together (if the patient agrees) to discuss it.

## 2018-02-28 NOTE — Telephone Encounter (Signed)
Dr Modesta Messing Patient's daughter called in stating that her Mom the patient is not cooperating lately. And she wanted to know if you could order the lithium level blood work @ [facility] in [place]?

## 2018-03-05 NOTE — Telephone Encounter (Signed)
This encounter was created in error - please disregard.

## 2018-05-13 ENCOUNTER — Ambulatory Visit (HOSPITAL_COMMUNITY): Payer: Medicare Other | Admitting: Psychiatry
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
## 2018-05-16 ENCOUNTER — Encounter (HOSPITAL_COMMUNITY): Payer: Self-pay | Admitting: Psychiatry

## 2018-05-16 ENCOUNTER — Ambulatory Visit (INDEPENDENT_AMBULATORY_CARE_PROVIDER_SITE_OTHER): Payer: Medicare Other | Admitting: Psychiatry

## 2018-05-16 VITALS — BP 128/71 | HR 83 | Ht 62.0 in | Wt 98.0 lb

## 2018-05-16 DIAGNOSIS — R450 Nervousness: Secondary | ICD-10-CM | POA: Diagnosis not present

## 2018-05-16 DIAGNOSIS — F319 Bipolar disorder, unspecified: Secondary | ICD-10-CM

## 2018-05-16 DIAGNOSIS — Z638 Other specified problems related to primary support group: Secondary | ICD-10-CM | POA: Diagnosis not present

## 2018-05-16 DIAGNOSIS — G3184 Mild cognitive impairment, so stated: Secondary | ICD-10-CM

## 2018-05-16 DIAGNOSIS — R197 Diarrhea, unspecified: Secondary | ICD-10-CM

## 2018-05-16 DIAGNOSIS — F0631 Mood disorder due to known physiological condition with depressive features: Secondary | ICD-10-CM

## 2018-05-16 DIAGNOSIS — G4700 Insomnia, unspecified: Secondary | ICD-10-CM

## 2018-05-16 DIAGNOSIS — Z599 Problem related to housing and economic circumstances, unspecified: Secondary | ICD-10-CM

## 2018-05-16 DIAGNOSIS — Z636 Dependent relative needing care at home: Secondary | ICD-10-CM

## 2018-05-16 DIAGNOSIS — F17210 Nicotine dependence, cigarettes, uncomplicated: Secondary | ICD-10-CM

## 2018-05-16 DIAGNOSIS — K219 Gastro-esophageal reflux disease without esophagitis: Secondary | ICD-10-CM

## 2018-05-16 DIAGNOSIS — F0630 Mood disorder due to known physiological condition, unspecified: Secondary | ICD-10-CM

## 2018-05-16 DIAGNOSIS — F419 Anxiety disorder, unspecified: Secondary | ICD-10-CM | POA: Diagnosis not present

## 2018-05-16 MED ORDER — QUETIAPINE FUMARATE 100 MG PO TABS: 100.0000 mg | ORAL_TABLET | Freq: Every day | ORAL | 1 refills | Status: AC

## 2018-05-16 MED ORDER — CLONAZEPAM 0.5 MG PO TABS: 0.5000 mg | ORAL_TABLET | Freq: Every day | ORAL | 2 refills | Status: DC | PRN

## 2018-05-16 MED ORDER — LITHIUM CARBONATE 300 MG PO TABS: 300.0000 mg | ORAL_TABLET | Freq: Every day | ORAL | 3 refills | Status: AC

## 2018-05-16 NOTE — Patient Instructions (Addendum)
1. Continue lithium 300 mg daily(Labs checked in 01/2018) 2. Continue quetiapine 100 mg a night 3. Continue clonazepam 0.5 mg daily as needed for anxiety  4.Return to clinic infour monthsfor 30 mins

## 2018-06-12 ENCOUNTER — Inpatient Hospital Stay (HOSPITAL_COMMUNITY): Payer: Medicare Other

## 2018-06-12 ENCOUNTER — Encounter (HOSPITAL_COMMUNITY): Payer: Self-pay | Admitting: Radiology

## 2018-06-12 ENCOUNTER — Emergency Department (HOSPITAL_COMMUNITY): Payer: Medicare Other

## 2018-06-12 ENCOUNTER — Inpatient Hospital Stay (HOSPITAL_COMMUNITY)
Admission: EM | Admit: 2018-06-12 | Discharge: 2018-06-17 | DRG: 054 | Disposition: A | Discharge status: Hospice | Payer: Medicare Other | Attending: Internal Medicine | Admitting: Internal Medicine

## 2018-06-12 DIAGNOSIS — F319 Bipolar disorder, unspecified: Secondary | ICD-10-CM | POA: Diagnosis present

## 2018-06-12 DIAGNOSIS — C719 Malignant neoplasm of brain, unspecified: Secondary | ICD-10-CM | POA: Diagnosis not present

## 2018-06-12 DIAGNOSIS — D496 Neoplasm of unspecified behavior of brain: Secondary | ICD-10-CM | POA: Diagnosis present

## 2018-06-12 DIAGNOSIS — Z681 Body mass index (BMI) 19 or less, adult: Secondary | ICD-10-CM

## 2018-06-12 DIAGNOSIS — F0391 Unspecified dementia with behavioral disturbance: Secondary | ICD-10-CM | POA: Diagnosis present

## 2018-06-12 DIAGNOSIS — G939 Disorder of brain, unspecified: Secondary | ICD-10-CM | POA: Diagnosis not present

## 2018-06-12 DIAGNOSIS — F17210 Nicotine dependence, cigarettes, uncomplicated: Secondary | ICD-10-CM | POA: Diagnosis present

## 2018-06-12 DIAGNOSIS — E43 Unspecified severe protein-calorie malnutrition: Secondary | ICD-10-CM

## 2018-06-12 DIAGNOSIS — Z66 Do not resuscitate: Secondary | ICD-10-CM | POA: Diagnosis present

## 2018-06-12 DIAGNOSIS — Z8673 Personal history of transient ischemic attack (TIA), and cerebral infarction without residual deficits: Secondary | ICD-10-CM

## 2018-06-12 DIAGNOSIS — C7931 Secondary malignant neoplasm of brain: Secondary | ICD-10-CM | POA: Diagnosis present

## 2018-06-12 DIAGNOSIS — E119 Type 2 diabetes mellitus without complications: Secondary | ICD-10-CM | POA: Diagnosis present

## 2018-06-12 DIAGNOSIS — J309 Allergic rhinitis, unspecified: Secondary | ICD-10-CM | POA: Diagnosis present

## 2018-06-12 DIAGNOSIS — G8929 Other chronic pain: Secondary | ICD-10-CM | POA: Diagnosis present

## 2018-06-12 DIAGNOSIS — Z515 Encounter for palliative care: Secondary | ICD-10-CM

## 2018-06-12 DIAGNOSIS — Z7189 Other specified counseling: Secondary | ICD-10-CM

## 2018-06-12 DIAGNOSIS — Z79899 Other long term (current) drug therapy: Secondary | ICD-10-CM

## 2018-06-12 DIAGNOSIS — G936 Cerebral edema: Secondary | ICD-10-CM | POA: Diagnosis present

## 2018-06-12 DIAGNOSIS — R451 Restlessness and agitation: Secondary | ICD-10-CM

## 2018-06-12 DIAGNOSIS — M549 Dorsalgia, unspecified: Secondary | ICD-10-CM | POA: Diagnosis present

## 2018-06-12 DIAGNOSIS — J449 Chronic obstructive pulmonary disease, unspecified: Secondary | ICD-10-CM | POA: Diagnosis present

## 2018-06-12 DIAGNOSIS — R000 Tachycardia, unspecified: Secondary | ICD-10-CM | POA: Diagnosis present

## 2018-06-12 DIAGNOSIS — Z7982 Long term (current) use of aspirin: Secondary | ICD-10-CM

## 2018-06-12 DIAGNOSIS — J984 Other disorders of lung: Secondary | ICD-10-CM | POA: Diagnosis present

## 2018-06-12 DIAGNOSIS — Z9049 Acquired absence of other specified parts of digestive tract: Secondary | ICD-10-CM

## 2018-06-12 DIAGNOSIS — R4182 Altered mental status, unspecified: Secondary | ICD-10-CM | POA: Diagnosis present

## 2018-06-12 DIAGNOSIS — E785 Hyperlipidemia, unspecified: Secondary | ICD-10-CM | POA: Diagnosis present

## 2018-06-12 DIAGNOSIS — G935 Compression of brain: Secondary | ICD-10-CM | POA: Diagnosis present

## 2018-06-12 DIAGNOSIS — G9389 Other specified disorders of brain: Secondary | ICD-10-CM

## 2018-06-12 DIAGNOSIS — Z882 Allergy status to sulfonamides status: Secondary | ICD-10-CM

## 2018-06-12 DIAGNOSIS — Z85841 Personal history of malignant neoplasm of brain: Secondary | ICD-10-CM

## 2018-06-12 DIAGNOSIS — I10 Essential (primary) hypertension: Secondary | ICD-10-CM | POA: Diagnosis present

## 2018-06-12 DIAGNOSIS — R443 Hallucinations, unspecified: Secondary | ICD-10-CM | POA: Diagnosis present

## 2018-06-12 DIAGNOSIS — R64 Cachexia: Secondary | ICD-10-CM | POA: Diagnosis present

## 2018-06-12 HISTORY — DX: Unspecified dementia, unspecified severity, without behavioral disturbance, psychotic disturbance, mood disturbance, and anxiety: F03.90

## 2018-06-12 LAB — CBC WITH DIFFERENTIAL/PLATELET
Abs Immature Granulocytes: 0 10*3/uL (ref 0.0–0.1)
BASOS PCT: 1 %
Basophils Absolute: 0.1 10*3/uL (ref 0.0–0.1)
EOS ABS: 0.3 10*3/uL (ref 0.0–0.7)
Eosinophils Relative: 4 %
HCT: 37.9 % (ref 36.0–46.0)
Hemoglobin: 11.9 g/dL — ABNORMAL LOW (ref 12.0–15.0)
Immature Granulocytes: 0 %
LYMPHS ABS: 2.4 10*3/uL (ref 0.7–4.0)
LYMPHS PCT: 29 %
MCH: 32 pg (ref 26.0–34.0)
MCHC: 31.4 g/dL (ref 30.0–36.0)
MCV: 101.9 fL — AB (ref 78.0–100.0)
Monocytes Absolute: 0.6 10*3/uL (ref 0.1–1.0)
Monocytes Relative: 8 %
Neutro Abs: 4.7 10*3/uL (ref 1.7–7.7)
Neutrophils Relative %: 58 %
PLATELETS: 354 10*3/uL (ref 150–400)
RBC: 3.72 MIL/uL — ABNORMAL LOW (ref 3.87–5.11)
RDW: 13.2 % (ref 11.5–15.5)
WBC: 8.1 10*3/uL (ref 4.0–10.5)

## 2018-06-12 LAB — I-STAT CHEM 8, ED
BUN: 22 mg/dL (ref 8–23)
CALCIUM ION: 1.12 mmol/L — AB (ref 1.15–1.40)
CREATININE: 0.5 mg/dL (ref 0.44–1.00)
Chloride: 104 mmol/L (ref 98–111)
Glucose, Bld: 88 mg/dL (ref 70–99)
HCT: 36 % (ref 36.0–46.0)
Hemoglobin: 12.2 g/dL (ref 12.0–15.0)
Potassium: 4.3 mmol/L (ref 3.5–5.1)
SODIUM: 139 mmol/L (ref 135–145)
TCO2: 28 mmol/L (ref 22–32)

## 2018-06-12 LAB — COMPREHENSIVE METABOLIC PANEL
ALT: 11 U/L (ref 0–44)
ANION GAP: 10 (ref 5–15)
AST: 16 U/L (ref 15–41)
Albumin: 3.4 g/dL — ABNORMAL LOW (ref 3.5–5.0)
Alkaline Phosphatase: 66 U/L (ref 38–126)
BILIRUBIN TOTAL: 0.5 mg/dL (ref 0.3–1.2)
BUN: 16 mg/dL (ref 8–23)
CO2: 24 mmol/L (ref 22–32)
Calcium: 9.1 mg/dL (ref 8.9–10.3)
Chloride: 106 mmol/L (ref 98–111)
Creatinine, Ser: 0.61 mg/dL (ref 0.44–1.00)
GFR calc Af Amer: >60 mL/min (ref >60)
Glucose, Bld: 91 mg/dL (ref 70–99)
POTASSIUM: 3.6 mmol/L (ref 3.5–5.1)
Sodium: 140 mmol/L (ref 135–145)
TOTAL PROTEIN: 5.7 g/dL — AB (ref 6.5–8.1)

## 2018-06-12 LAB — I-STAT VENOUS BLOOD GAS, ED
Bicarbonate: 25.8 mmol/L (ref 20.0–28.0)
O2 Saturation: 89 %
PCO2 VEN: 43.4 mmHg — AB (ref 44.0–60.0)
PO2 VEN: 58 mmHg — AB (ref 32.0–45.0)
TCO2: 27 mmol/L (ref 22–32)
pH, Ven: 7.382 (ref 7.250–7.430)

## 2018-06-12 LAB — I-STAT CG4 LACTIC ACID, ED: LACTIC ACID, VENOUS: 0.63 mmol/L (ref 0.5–1.9)

## 2018-06-12 LAB — AMMONIA: Ammonia: 28 umol/L (ref 9–35)

## 2018-06-12 LAB — ETHANOL

## 2018-06-12 MED ORDER — BUTALBITAL-APAP-CAFFEINE 50-325-40 MG PO TABS
1.0000 | ORAL_TABLET | Freq: Once | ORAL | Status: AC
Start: 2018-06-12 — End: 2018-06-12
  Administered 2018-06-12: 1 via ORAL
  Filled 2018-06-12: qty 1

## 2018-06-12 MED ORDER — LORAZEPAM 2 MG/ML IJ SOLN
1.0000 mg | Freq: Once | INTRAMUSCULAR | Status: AC
  Administered 2018-06-12: 1 mg via INTRAVENOUS
  Filled 2018-06-12: qty 1

## 2018-06-12 MED ORDER — IOHEXOL 300 MG/ML  SOLN
75.0000 mL | Freq: Once | INTRAMUSCULAR | Status: AC | PRN
  Administered 2018-06-12: 75 mL via INTRAVENOUS

## 2018-06-12 MED ORDER — DEXAMETHASONE SODIUM PHOSPHATE 10 MG/ML IJ SOLN
10.0000 mg | Freq: Once | INTRAMUSCULAR | Status: AC
  Administered 2018-06-12: 10 mg via INTRAVENOUS
  Filled 2018-06-12: qty 1

## 2018-06-12 MED ORDER — DEXAMETHASONE SODIUM PHOSPHATE 10 MG/ML IJ SOLN
6.0000 mg | Freq: Four times a day (QID) | INTRAMUSCULAR | Status: AC
  Administered 2018-06-13 – 2018-06-14 (×4): 6 mg via INTRAVENOUS
  Filled 2018-06-12 (×4): qty 1

## 2018-06-12 NOTE — ED Triage Notes (Signed)
Pt arrived via [facility] from [place] area. EMS reports pt has been behaving unusually, making abnormal commands to family and not recognizing family members. Pt has hx of such and has been seen previously at [facility]. Notes given to EDP upon arrival to ED. Pt is alert to self, location, and day at time of triage.

## 2018-06-12 NOTE — H&P (Signed)
[facility] H&P   Patient Demographics:    Sara Silva, is a 77 y.o. female  MRN: [id number]   DOB - 1941/03/23  Admit Date - 06/12/2018  Outpatient Primary MD for the patient is Marjo Bicker, MD  Referring MD/NP/PA: Deno Etienne  Outpatient Specialists:    Patient coming from: home  Chief Complaint  Patient presents with  . Altered Mental Status      HPI:    Sara Silva  is a 77 y.o. female, w hypertension, hyperlipidemia, Dm2, Copd not on home o2, Dementia apparently presents with AMS, more agitation and memory loss per her daughter.   In ED,  CT brain  IMPRESSION: Left anterior temporal lobe ring-enhancing lesion measuring 16 mm with increased vasogenic edema. Associated mass effect with 4 mm left-to-right midline shift and mild left uncal herniation. The lesion likely represents a metastasis or abscess. High-grade primary CNS neoplasm could also have this appearance.  CXR IMPRESSION: Hyperinflated lungs without active pulmonary disease.  Na 140, K 3.6, Bun 16, Creatinine 0.61 Ast 16, Alt 11 Alk phos 66, T. Bili 0.5  Etoh <10  Wbc 8.1, Hgb 11.9, Plt 354 Mcv 101.9  Lactic acid 0.63  Neurosurgery consulted by ED, requested medicine admission  Pt will be admitted for new brain mass with shift.      Review of systems:    In addition to the HPI above,  No Fever-chills, No Headache, No changes with Vision or hearing, No problems swallowing food or Liquids, No Chest pain, Cough or Shortness of Breath, No Abdominal pain, No Nausea or Vommitting, Bowel movements are regular, No Blood in stool or Urine, No dysuria, No new skin rashes or bruises, No new joints pains-aches,  No new weakness, tingling, numbness in any extremity, No recent weight gain or loss, No polyuria, polydypsia or polyphagia, No significant Mental Stressors.  A full 10 point  Review of Systems was done, except as stated above, all other Review of Systems were negative.   With Past History of the following :    Past Medical History:  Diagnosis Date  . Chronic back pain   . COPD (chronic obstructive pulmonary disease) ([place])   . Dementia   . Diabetes mellitus   . High cholesterol   . Hypercholesteremia   . Nervous breakdown       Past Surgical History:  Procedure Laterality Date  . BIOPSY  01/30/2016   Procedure: BIOPSY;  Surgeon: Daneil Dolin, MD;  Location[facility];  Service: Endoscopy;;  . CHOLECYSTECTOMY    . COLONOSCOPY  10/16/2005   [place], [place]- no polyps or inflammatory disease, normal tcs.  . COLONOSCOPY WITH PROPOFOL N/A 01/30/2016   CWU:[id number] diverticulosis/redundant colon s/p bx  . ESOPHAGOGASTRODUODENOSCOPY (EGD) WITH PROPOFOL N/A 01/30/2016   HWT:[id number] gastric mucosa s/p bx  . ESOPHAGOGASTRODUODENOSCOPY ENDOSCOPY  02/26/11   Dr.Shifflet- small hiatla hernia, normal stomach  except antal gastritis. esophageal bx= benign squamous mucosa, antrum bx= mild chronic gastrophathy.   Marland Kitchen HIP SURGERY Right    pinning      Social History:     Social History   Tobacco Use  . Smoking status: Current Every Day Smoker    Packs/day: 1.00    Years: 30.00    Pack years: 30.00    Types: Cigarettes    Last attempt to quit: 01/17/2016    Years since quitting: 2.4  . Smokeless tobacco: Never Used  . Tobacco comment: one half pack daily  Substance Use Topics  . Alcohol use: No    Alcohol/week: 0.0 oz     Lives - at home with son who has cerebral palsy  Mobility - unclear   Family History :     Family History  Problem Relation Age of Onset  . Ovarian cancer Sister   . Colon cancer Neg Hx       Home Medications:   Prior to Admission medications   Medication Sig Start Date End Date Taking? Authorizing Provider  aspirin EC 81 MG tablet Take 81 mg by mouth daily.   Yes [provider]  Calcium Carbonate Antacid (TUMS  PO) Take 1 tablet by mouth daily as needed (heartburn).    Yes [provider]  clonazePAM (KLONOPIN) 0.5 MG tablet Take 1 tablet (0.5 mg total) by mouth daily as needed for anxiety. 05/25/18  Yes Norman Clay, MD  lithium 300 MG tablet Take 1 tablet (300 mg total) by mouth daily. 05/16/18 09/13/18 Yes Hisada, Elie Goody, MD  nitroGLYCERIN (NITROSTAT) 0.4 MG SL tablet Place 0.4 mg under the tongue every 5 (five) minutes as needed for chest pain.   Yes [provider]  omeprazole (PRILOSEC) 20 MG capsule Take 1 capsule (20 mg total) by mouth daily before breakfast. 03/23/16  Yes Mahala Menghini, PA-C  QUEtiapine (SEROQUEL) 100 MG tablet Take 1 tablet (100 mg total) by mouth at bedtime. 05/16/18  Yes Norman Clay, MD     Allergies:     Allergies  Allergen Reactions  . Sulfonamide Derivatives Hives     Physical Exam:   Vitals  Blood pressure (!) 124/92, pulse (!) 112, temperature 98.3 F (36.8 C), temperature source Oral, resp. rate 17, SpO2 97 %.   1. General  lying in bed in NAD,    2. Normal affect and insight, Not Suicidal or Homicidal, Awake Alert, Oriented X 1.  3. No F.N deficits, ALL C.Nerves Intact, Strength 5/5 all 4 extremities, Sensation intact all 4 extremities, Plantars down going.  4. Ears and Eyes appear Normal, Conjunctivae clear, PERRLA. Moist Oral Mucosa.  5. Supple Neck, No JVD, No cervical lymphadenopathy appriciated, No Carotid Bruits.  6. Symmetrical Chest wall movement, Good air movement bilaterally, CTAB.  7. RRR, No Gallops, Rubs or Murmurs, No Parasternal Heave.  8. Positive Bowel Sounds, Abdomen Soft, No tenderness, No organomegaly appriciated,No rebound -guarding or rigidity.  9.  No Cyanosis, Normal Skin Turgor, No Skin Rash or Bruise.  10. Good muscle tone,  joints appear normal , no effusions, Normal ROM.  11. No Palpable Lymph Nodes in Neck or Axillae     Data Review:    CBC Recent Labs  Lab 06/12/18 1924 06/12/18 1939    WBC 8.1  --   HGB 11.9* 12.2  HCT 37.9 36.0  PLT 354  --   MCV 101.9*  --   MCH 32.0  --   MCHC 31.4  --  RDW 13.2  --   LYMPHSABS 2.4  --   MONOABS 0.6  --   EOSABS 0.3  --   BASOSABS 0.1  --    ------------------------------------------------------------------------------------------------------------------  Chemistries  Recent Labs  Lab 06/12/18 1924 06/12/18 1939  NA 140 139  K 3.6 4.3  CL 106 104  CO2 24  --   GLUCOSE 91 88  BUN 16 22  CREATININE 0.61 0.50  CALCIUM 9.1  --   AST 16  --   ALT 11  --   ALKPHOS 66  --   BILITOT 0.5  --    ------------------------------------------------------------------------------------------------------------------ CrCl cannot be calculated (Unknown ideal weight.). ------------------------------------------------------------------------------------------------------------------ No results for input(s): TSH, T4TOTAL, T3FREE, THYROIDAB in the last 72 hours.  Invalid input(s): FREET3  Coagulation profile No results for input(s): INR, PROTIME in the last 168 hours. ------------------------------------------------------------------------------------------------------------------- No results for input(s): DDIMER in the last 72 hours. -------------------------------------------------------------------------------------------------------------------  Cardiac Enzymes No results for input(s): CKMB, TROPONINI, MYOGLOBIN in the last 168 hours.  Invalid input(s): CK ------------------------------------------------------------------------------------------------------------------ No results found for: BNP   ---------------------------------------------------------------------------------------------------------------  Urinalysis No results found for: COLORURINE, APPEARANCEUR, LABSPEC, PHURINE, GLUCOSEU, HGBUR, BILIRUBINUR, KETONESUR, PROTEINUR, UROBILINOGEN, NITRITE,  LEUKOCYTESUR  ----------------------------------------------------------------------------------------------------------------   Imaging Results:    Dg Chest 2 View  Result Date: 06/12/2018 CLINICAL DATA:  Altered mental status, current smoker. EXAM: CHEST - 2 VIEW COMPARISON:  05/21/2018 FINDINGS: Heart size is normal. Moderate aortic atherosclerosis without aneurysm. The lungs are hyperinflated without pulmonary consolidation, effusion or pneumothorax. The right lateral costophrenic angle is obscured by the patient's overlying arm. No acute osseous abnormality. IMPRESSION: Hyperinflated lungs without active pulmonary disease. Electronically Signed   By: Ashley Royalty M.D.   On: 06/12/2018 19:30   Ct Head W Or Wo Contrast  Result Date: 06/12/2018 CLINICAL DATA:  77 y/o  F; headache, malignancy suspected. EXAM: CT HEAD WITHOUT AND WITH CONTRAST TECHNIQUE: Contiguous axial images were obtained from the base of the skull through the vertex without and with intravenous contrast CONTRAST:  67m OMNIPAQUE IOHEXOL 300 MG/ML  SOLN COMPARISON:  05/21/2018 CT head.  05/21/2018 MRI head. FINDINGS: Brain: Left anterior temporal lobe intra-axial ring-enhancing lesion measuring 14 x 16 x 16 mm (AP x ML x CC series 5, image 9 and series 8, image 38). Severe edema throughout the left temporal lobe extending into the posterior insula progressed from the prior CT of the head. Associated mass effect with 4 mm of left-to-right midline shift and left uncal herniation. 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 Stable chronic microvascular ischemic changes and parenchymal volume loss of the brain. No downward herniation, extra-axial collection, or effacement of basilar cisterns. Vascular: No hyperdense vessel or unexpected calcification. Visible vessels are patent. Skull: Normal. Negative for fracture or focal lesion. Sinuses/Orbits: No acute finding. Other: None. IMPRESSION: Left anterior temporal lobe  ring-enhancing lesion measuring 16 mm with increased vasogenic edema. Associated mass effect with 4 mm left-to-right midline shift and mild left uncal herniation. The lesion likely represents a metastasis or abscess. High-grade primary CNS neoplasm could also have this appearance. These results were called by telephone at the time of interpretation on 06/12/2018 at 9:23 pm to Dr. DDeno Etienne, who verbally acknowledged these results. Electronically Signed   By: LKristine GarbeM.D.   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  Neurosurgery consulted by ED,  Please make sure stops by in AM  AMS secondary to baseline dementia and brain tumor Versed 0.254miv q6h prn   Dementia Cont lithium Cont seroquel  Tachycardia Monitor    DVT Prophylaxis  SCDs   AM Labs Ordered, also please review Full Orders  Family Communication: Admission, patients condition and plan of care including tests being ordered have been discussed with the patient and daughter who indicate understanding and agree with the plan and Code Status.  Code Status DNR  Likely DC to  Home in AM  Condition GUARDED    Consults called: neurosurgery by ED, please touch base with neurosurgery in AM to make sure comes to talk with family .  Admission status: observation  Time spent in minutes : [id number] JaJani Gravel.D on 06/12/2018 at 11:38 PM  Between 7am to 7pm - Pager - [phone] After 7pm go to [email or web address] - password [email or web address] - Office  [phone]

## 2018-06-12 NOTE — ED Provider Notes (Signed)
[facility] EMERGENCY DEPARTMENT Provider Note   CSN: [id number] Arrival date & time: 06/12/18  1707     History   Chief Complaint Chief Complaint  Patient presents with  . Altered Mental Status    HPI Sara Silva is a 77 y.o. female.  77 yo F with a chief complaint of hallucinations and change in her personality this is been going on for the past month or so.  The patient was seen about a month ago and had had a fall, at that visit she had a CT scan that was concerning for vasogenic edema and had a resultant MRI.  MRI was concerning for possible mass to the the imaging was limited due to patient compliance with imaging.  Since then she has been at home.  Family feels that she has had worsening outbursts of anger and has had episodes where she has been talking to people that are not there.  She does have a history of underlying dementia.  They deny recent injury.  Denied infectious symptoms, cough congestion fevers chills myalgias abdominal pain vomiting diarrhea dysuria.  The history is provided by the patient.  Altered Mental Status   This is a chronic problem. The current episode started more than 1 week ago. The problem has been gradually worsening. Associated symptoms include hallucinations. Past medical history comments: brain CA.    Past Medical History:  Diagnosis Date  . Chronic back pain   . COPD (chronic obstructive pulmonary disease) ([place])   . Dementia   . Diabetes mellitus   . High cholesterol   . Hypercholesteremia   . Nervous breakdown     Patient Active Problem List   Diagnosis Date Noted  . 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 Constipation 03/23/2016  . Melena 03/23/2016  . Reflux esophagitis   . Mucosal abnormality of stomach   . Chronic diarrhea   . Diverticulosis of colon without hemorrhage   . Abdominal pain 01/03/2015  . Diarrhea 01/03/2015  . DM  01/11/2010  . HYPERLIPIDEMIA 01/11/2010  . STROKE 01/11/2010  . ALLERGIC RHINITIS 01/11/2010  . EMPHYSEMA 01/11/2010    Past Surgical History:  Procedure Laterality Date  . BIOPSY  01/30/2016   Procedure: BIOPSY;  Surgeon: Daneil Dolin, MD;  [facility];  Service: Endoscopy;;  . CHOLECYSTECTOMY    . COLONOSCOPY  10/16/2005   [place], [place]- no polyps or inflammatory disease, normal tcs.  . COLONOSCOPY WITH PROPOFOL N/A 01/30/2016   [id number] diverticulosis/redundant colon s/p bx  . ESOPHAGOGASTRODUODENOSCOPY (EGD) WITH PROPOFOL N/A 01/30/2016   [id number]:[id number] gastric mucosa s/p bx  . ESOPHAGOGASTRODUODENOSCOPY ENDOSCOPY  02/26/11   Dr.Shifflet- small hiatla hernia, normal stomach except antal gastritis. esophageal bx= benign squamous mucosa, antrum bx= mild chronic gastrophathy.   Marland Kitchen HIP SURGERY Right    pinning     OB History   None      Home Medications    Prior to Admission medications   Medication Sig Start Date End Date Taking? Authorizing Provider  aspirin EC 81 MG tablet Take 81 mg by mouth daily.   Yes [provider]  Calcium Carbonate Antacid (TUMS PO) Take 1 tablet by mouth daily as needed (heartburn).    Yes [provider]  clonazePAM (KLONOPIN) 0.5 MG tablet Take 1 tablet (0.5 mg total) by mouth daily as needed for anxiety. 05/25/18  Yes Norman Clay, MD  lithium 300 MG tablet Take 1 tablet (  300 mg total) by mouth daily. 05/16/18 09/13/18 Yes Hisada, Elie Goody, MD  nitroGLYCERIN (NITROSTAT) 0.4 MG SL tablet Place 0.4 mg under the tongue every 5 (five) minutes as needed for chest pain.   Yes [provider]  omeprazole (PRILOSEC) 20 MG capsule Take 1 capsule (20 mg total) by mouth daily before breakfast. 03/23/16  Yes Mahala Menghini, PA-C  QUEtiapine (SEROQUEL) 100 MG tablet Take 1 tablet (100 mg total) by mouth at bedtime. 05/16/18  Yes Norman Clay, MD    Family History Family History  Problem Relation Age of Onset  . Ovarian  cancer Sister   . Colon cancer Neg Hx     Social History Social History   Tobacco Use  . Smoking status: Current Every Day Smoker    Packs/day: 1.00    Years: 30.00    Pack years: 30.00    Types: Cigarettes    Last attempt to quit: 01/17/2016    Years since quitting: 2.4  . Smokeless tobacco: Never Used  . Tobacco comment: one half pack daily  Substance Use Topics  . Alcohol use: No    Alcohol/week: 0.0 oz  . Drug use: No     Allergies   Sulfonamide derivatives   Review of Systems Review of Systems  Constitutional: Negative for chills and fever.  HENT: Negative for congestion and rhinorrhea.   Eyes: Negative for redness and visual disturbance.  Respiratory: Negative for shortness of breath and wheezing.   Cardiovascular: Negative for chest pain and palpitations.  Gastrointestinal: Negative for nausea and vomiting.  Genitourinary: Negative for dysuria and urgency.  Musculoskeletal: Negative for arthralgias and myalgias.  Skin: Negative for pallor and wound.  Neurological: Negative for dizziness and headaches.  Psychiatric/Behavioral: Positive for hallucinations.     Physical Exam Updated Vital Signs BP (!) 107/47   Pulse 78   Temp 98.3 F (36.8 C) (Oral)   Resp 18   SpO2 95%   Physical Exam  Constitutional: She is oriented to person, place, and time. She appears well-developed and well-nourished. No distress.  HENT:  Head: Normocephalic and atraumatic.  Eyes: Pupils are equal, round, and reactive to light. EOM are normal.  Neck: Normal range of motion. Neck supple.  Cardiovascular: Normal rate and regular rhythm. Exam reveals no gallop and no friction rub.  No murmur heard. Pulmonary/Chest: Effort normal. She has no wheezes. She has no rales.  Abdominal: Soft. She exhibits no distension and no mass. There is no tenderness. There is no guarding.  Musculoskeletal: She exhibits no edema or tenderness.  Neurological: She is alert and oriented to person,  place, and time.  Skin: Skin is warm and dry. She is not diaphoretic.  Psychiatric: She has a normal mood and affect. Her behavior is normal.  Nursing note and vitals reviewed.    ED Treatments / Results  Labs (all labs ordered are listed, but only abnormal results are displayed) Labs Reviewed  COMPREHENSIVE METABOLIC PANEL - Abnormal; Notable for the following components:      Result Value   Total Protein 5.7 (*)    Albumin 3.4 (*)    All other components within normal limits  CBC WITH DIFFERENTIAL/PLATELET - Abnormal; Notable for the following components:   RBC 3.72 (*)    Hemoglobin 11.9 (*)    MCV 101.9 (*)    All other components within normal limits  I-STAT CHEM 8, ED - Abnormal; Notable for the following components:   Calcium, Ion 1.12 (*)    All  other components within normal limits  I-STAT VENOUS BLOOD GAS, ED - Abnormal; Notable for the following components:   pCO2, Ven 43.4 (*)    pO2, Ven 58.0 (*)    All other components within normal limits  URINE CULTURE  AMMONIA  ETHANOL  URINALYSIS, ROUTINE W REFLEX MICROSCOPIC  I-STAT CG4 LACTIC ACID, ED  CBG MONITORING, ED    EKG None  Radiology Dg Chest 2 View  Result Date: 06/12/2018 CLINICAL DATA:  Altered mental status, current smoker. EXAM: CHEST - 2 VIEW COMPARISON:  05/21/2018 FINDINGS: Heart size is normal. Moderate aortic atherosclerosis without aneurysm. The lungs are hyperinflated without pulmonary consolidation, effusion or pneumothorax. The right lateral costophrenic angle is obscured by the patient's overlying arm. No acute osseous abnormality. IMPRESSION: Hyperinflated lungs without active pulmonary disease. Electronically Signed   By: Ashley Royalty M.D.   On: 06/12/2018 19:30   Ct Head W Or Wo Contrast  Result Date: 06/12/2018 CLINICAL DATA:  77 y/o  F; headache, malignancy suspected. EXAM: CT HEAD WITHOUT AND WITH CONTRAST TECHNIQUE: Contiguous axial images were obtained from the base of the skull through  the vertex without and with intravenous contrast CONTRAST:  76mL OMNIPAQUE IOHEXOL 300 MG/ML  SOLN COMPARISON:  05/21/2018 CT head.  05/21/2018 MRI head. FINDINGS: Brain: Left anterior temporal lobe intra-axial ring-enhancing lesion measuring 14 x 16 x 16 mm (AP x ML x CC series 5, image 9 and series 8, image 38). Severe edema throughout the left temporal lobe extending into the posterior insula progressed from the prior CT of the head. Associated mass effect with 4 mm of left-to-right midline shift and left uncal herniation. 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 Stable chronic microvascular ischemic changes and parenchymal volume loss of the brain. No downward herniation, extra-axial collection, or effacement of basilar cisterns. Vascular: No hyperdense vessel or unexpected calcification. Visible vessels are patent. Skull: Normal. Negative for fracture or focal lesion. Sinuses/Orbits: No acute finding. Other: None. IMPRESSION: Left anterior temporal lobe ring-enhancing lesion measuring 16 mm with increased vasogenic edema. Associated mass effect with 4 mm left-to-right midline shift and mild left uncal herniation. The lesion likely represents a metastasis or abscess. High-grade primary CNS neoplasm could also have this appearance. These results were called by telephone at the time of interpretation on 06/12/2018 at 9:23 pm to Dr. Deno Etienne , who verbally acknowledged these results. Electronically Signed   By: Kristine Garbe M.D.   On: 06/12/2018 21:24    Procedures Procedures (including critical care time)  Medications Ordered in ED Medications  dexamethasone (DECADRON) injection 10 mg (has no administration in time range)  dexamethasone (DECADRON) injection 6 mg (has no administration in time range)  butalbital-acetaminophen-caffeine (FIORICET, ESGIC) 50-325-40 MG per tablet 1 tablet (1 tablet Oral Given 06/12/18 1903)  iohexol (OMNIPAQUE) 300 MG/ML solution 75 mL (75 mLs  Intravenous Contrast Given 06/12/18 2032)     Initial Impression / Assessment and Plan / ED Course  I have reviewed the triage vital signs and the nursing notes.  Pertinent labs & imaging results that were available during my care of the patient were reviewed by me and considered in my medical decision making (see chart for details).     77 yo F with a cc of AMS.  Going on for past month.  Patient seen recently and thought to have brain ca.   Since then the patient has had worsening at home.  The family had called their neurologist who is able to see them  today but the patient was unwilling to go if I convinced her to leave the house but at that time the neurologist recommended they come here and be evaluated by the neurologist at bedside.  CT scan is concerning for a ring-enhancing lesion with vasogenic edema.  This is worsening from her imaging about a month ago.  I discussed the case with neurology recommended I discussed it with neurosurgery.  Discussed with Dr. Vertell Limber at this point he is not sure if it is primary or secondary lesion and recommended hospitalist admission for MRI and possible further tumor work-up.  He also recommended Decadron.  10 mg IV now followed by 6 mg every 6 hours for 4 doses and then to 4 mg every 6.  CRITICAL CARE Performed by: Cecilio Asper   Total critical care time: 35 minutes  Critical care time was exclusive of separately billable procedures and treating other patients.  Critical care was necessary to treat or prevent imminent or life-threatening deterioration.  Critical care was time spent personally by me on the following activities: development of treatment plan with patient and/or surrogate as well as nursing, discussions with consultants, evaluation of patient's response to treatment, examination of patient, obtaining history from patient or surrogate, ordering and performing treatments and interventions, ordering and review of laboratory  studies, ordering and review of radiographic studies, pulse oximetry and re-evaluation of patient's condition.  The patients results and plan were reviewed and discussed.   Any x-rays performed were independently reviewed by myself.   Differential diagnosis were considered with the presenting HPI.  Medications  dexamethasone (DECADRON) injection 10 mg (has no administration in time range)  dexamethasone (DECADRON) injection 6 mg (has no administration in time range)  butalbital-acetaminophen-caffeine (FIORICET, ESGIC) 50-325-40 MG per tablet 1 tablet (1 tablet Oral Given 06/12/18 1903)  iohexol (OMNIPAQUE) 300 MG/ML solution 75 mL (75 mLs Intravenous Contrast Given 06/12/18 2032)    Vitals:   06/12/18 1915 06/12/18 2045 06/12/18 2100 06/12/18 2147  BP: (!) 121/57 (!) 114/51  (!) 107/47  Pulse: (!) 28  79 78  Resp: (!) 21  17 18   Temp:      TempSrc:      SpO2: 94%  95%     Final diagnoses:  Brain mass    Admission/ observation were discussed with the admitting physician, patient and/or family and they are comfortable with the plan.    Final Clinical Impressions(s) / ED Diagnoses   Final diagnoses:  Brain mass    ED Discharge Orders    None       Deno Etienne, DO 06/12/18 2234

## 2018-06-13 ENCOUNTER — Other Ambulatory Visit: Payer: Self-pay

## 2018-06-13 ENCOUNTER — Inpatient Hospital Stay (HOSPITAL_COMMUNITY): Payer: Medicare Other

## 2018-06-13 ENCOUNTER — Telehealth: Payer: Self-pay | Admitting: Neurology

## 2018-06-13 DIAGNOSIS — J309 Allergic rhinitis, unspecified: Secondary | ICD-10-CM | POA: Diagnosis present

## 2018-06-13 DIAGNOSIS — F17210 Nicotine dependence, cigarettes, uncomplicated: Secondary | ICD-10-CM | POA: Diagnosis present

## 2018-06-13 DIAGNOSIS — R4182 Altered mental status, unspecified: Secondary | ICD-10-CM | POA: Diagnosis not present

## 2018-06-13 DIAGNOSIS — E785 Hyperlipidemia, unspecified: Secondary | ICD-10-CM | POA: Diagnosis present

## 2018-06-13 DIAGNOSIS — R64 Cachexia: Secondary | ICD-10-CM | POA: Diagnosis present

## 2018-06-13 DIAGNOSIS — D496 Neoplasm of unspecified behavior of brain: Secondary | ICD-10-CM | POA: Diagnosis not present

## 2018-06-13 DIAGNOSIS — I10 Essential (primary) hypertension: Secondary | ICD-10-CM | POA: Diagnosis present

## 2018-06-13 DIAGNOSIS — Z7189 Other specified counseling: Secondary | ICD-10-CM | POA: Diagnosis not present

## 2018-06-13 DIAGNOSIS — G939 Disorder of brain, unspecified: Secondary | ICD-10-CM | POA: Diagnosis present

## 2018-06-13 DIAGNOSIS — E43 Unspecified severe protein-calorie malnutrition: Secondary | ICD-10-CM

## 2018-06-13 DIAGNOSIS — Z681 Body mass index (BMI) 19 or less, adult: Secondary | ICD-10-CM | POA: Diagnosis not present

## 2018-06-13 DIAGNOSIS — Z8673 Personal history of transient ischemic attack (TIA), and cerebral infarction without residual deficits: Secondary | ICD-10-CM | POA: Diagnosis not present

## 2018-06-13 DIAGNOSIS — R000 Tachycardia, unspecified: Secondary | ICD-10-CM | POA: Diagnosis present

## 2018-06-13 DIAGNOSIS — G936 Cerebral edema: Secondary | ICD-10-CM | POA: Diagnosis present

## 2018-06-13 DIAGNOSIS — F319 Bipolar disorder, unspecified: Secondary | ICD-10-CM | POA: Diagnosis present

## 2018-06-13 DIAGNOSIS — C719 Malignant neoplasm of brain, unspecified: Secondary | ICD-10-CM | POA: Diagnosis present

## 2018-06-13 DIAGNOSIS — M549 Dorsalgia, unspecified: Secondary | ICD-10-CM | POA: Diagnosis present

## 2018-06-13 DIAGNOSIS — F0391 Unspecified dementia with behavioral disturbance: Secondary | ICD-10-CM | POA: Diagnosis present

## 2018-06-13 DIAGNOSIS — J984 Other disorders of lung: Secondary | ICD-10-CM | POA: Diagnosis present

## 2018-06-13 DIAGNOSIS — C7931 Secondary malignant neoplasm of brain: Secondary | ICD-10-CM | POA: Diagnosis present

## 2018-06-13 DIAGNOSIS — Z85841 Personal history of malignant neoplasm of brain: Secondary | ICD-10-CM | POA: Diagnosis not present

## 2018-06-13 DIAGNOSIS — J449 Chronic obstructive pulmonary disease, unspecified: Secondary | ICD-10-CM | POA: Diagnosis present

## 2018-06-13 DIAGNOSIS — E119 Type 2 diabetes mellitus without complications: Secondary | ICD-10-CM | POA: Diagnosis present

## 2018-06-13 DIAGNOSIS — Z515 Encounter for palliative care: Secondary | ICD-10-CM | POA: Diagnosis not present

## 2018-06-13 DIAGNOSIS — G8929 Other chronic pain: Secondary | ICD-10-CM | POA: Diagnosis present

## 2018-06-13 DIAGNOSIS — R451 Restlessness and agitation: Secondary | ICD-10-CM | POA: Diagnosis not present

## 2018-06-13 DIAGNOSIS — Z66 Do not resuscitate: Secondary | ICD-10-CM | POA: Diagnosis present

## 2018-06-13 DIAGNOSIS — R443 Hallucinations, unspecified: Secondary | ICD-10-CM | POA: Diagnosis present

## 2018-06-13 DIAGNOSIS — G935 Compression of brain: Secondary | ICD-10-CM | POA: Diagnosis present

## 2018-06-13 DIAGNOSIS — Z9049 Acquired absence of other specified parts of digestive tract: Secondary | ICD-10-CM | POA: Diagnosis not present

## 2018-06-13 LAB — URINALYSIS, ROUTINE W REFLEX MICROSCOPIC
Bilirubin Urine: NEGATIVE
GLUCOSE, UA: 150 mg/dL — AB
Hgb urine dipstick: NEGATIVE
Ketones, ur: NEGATIVE mg/dL
LEUKOCYTES UA: NEGATIVE
Nitrite: NEGATIVE
PH: 6 (ref 5.0–8.0)
Protein, ur: NEGATIVE mg/dL
SPECIFIC GRAVITY, URINE: 1.024 (ref 1.005–1.030)

## 2018-06-13 LAB — GLUCOSE, CAPILLARY
GLUCOSE-CAPILLARY: 110 mg/dL — AB (ref 70–99)
GLUCOSE-CAPILLARY: 157 mg/dL — AB (ref 70–99)
Glucose-Capillary: 113 mg/dL — ABNORMAL HIGH (ref 70–99)
Glucose-Capillary: 166 mg/dL — ABNORMAL HIGH (ref 70–99)
Glucose-Capillary: 257 mg/dL — ABNORMAL HIGH (ref 70–99)
Glucose-Capillary: 140 mg/dL — ABNORMAL HIGH (ref 70–99)

## 2018-06-13 LAB — CBG MONITORING, ED: Glucose-Capillary: 100 mg/dL — ABNORMAL HIGH (ref 70–99)

## 2018-06-13 MED ORDER — ADULT MULTIVITAMIN W/MINERALS CH
1.0000 | ORAL_TABLET | Freq: Every day | ORAL | Status: DC
  Administered 2018-06-13 – 2018-06-14 (×2): 1 via ORAL
  Filled 2018-06-13 (×2): qty 1

## 2018-06-13 MED ORDER — ACETAMINOPHEN 650 MG RE SUPP: 650.0000 mg | Freq: Four times a day (QID) | RECTAL | Status: DC | PRN

## 2018-06-13 MED ORDER — LITHIUM CARBONATE 300 MG PO CAPS
300.0000 mg | ORAL_CAPSULE | Freq: Every day | ORAL | Status: DC
  Administered 2018-06-13 – 2018-06-16 (×4): 300 mg via ORAL
  Filled 2018-06-13 (×6): qty 1

## 2018-06-13 MED ORDER — SODIUM CHLORIDE 0.9 % IV SOLN: 250.0000 mL | INTRAVENOUS | Status: DC | PRN

## 2018-06-13 MED ORDER — PANTOPRAZOLE SODIUM 40 MG PO TBEC
40.0000 mg | DELAYED_RELEASE_TABLET | Freq: Every day | ORAL | Status: DC
Start: 2018-06-13 — End: 2018-06-17
  Administered 2018-06-13 – 2018-06-16 (×4): 40 mg via ORAL
  Filled 2018-06-13 (×5): qty 1

## 2018-06-13 MED ORDER — LORAZEPAM 2 MG/ML IJ SOLN
0.5000 mg | INTRAMUSCULAR | Status: DC | PRN
  Administered 2018-06-13: 0.5 mg via INTRAVENOUS
  Filled 2018-06-13 (×2): qty 1

## 2018-06-13 MED ORDER — SODIUM CHLORIDE 0.9% FLUSH
3.0000 mL | Freq: Two times a day (BID) | INTRAVENOUS | Status: DC
  Administered 2018-06-13 – 2018-06-16 (×7): 3 mL via INTRAVENOUS

## 2018-06-13 MED ORDER — IOHEXOL 300 MG/ML  SOLN
100.0000 mL | Freq: Once | INTRAMUSCULAR | Status: AC | PRN
  Administered 2018-06-13: 100 mL via INTRAVENOUS

## 2018-06-13 MED ORDER — LORAZEPAM 2 MG/ML IJ SOLN
1.0000 mg | INTRAMUSCULAR | Status: DC | PRN
  Administered 2018-06-13 – 2018-06-14 (×3): 1 mg via INTRAVENOUS
  Filled 2018-06-13 (×2): qty 1

## 2018-06-13 MED ORDER — MIDAZOLAM HCL 2 MG/2ML IJ SOLN
0.2500 mg | Freq: Four times a day (QID) | INTRAMUSCULAR | Status: DC | PRN
  Administered 2018-06-13: 0.25 mg via INTRAVENOUS
  Filled 2018-06-13: qty 2

## 2018-06-13 MED ORDER — ACETAMINOPHEN 325 MG PO TABS: 650.0000 mg | ORAL_TABLET | Freq: Four times a day (QID) | ORAL | Status: DC | PRN

## 2018-06-13 MED ORDER — ENSURE ENLIVE PO LIQD
237.0000 mL | Freq: Two times a day (BID) | ORAL | Status: DC
  Administered 2018-06-13 – 2018-06-16 (×6): 237 mL via ORAL

## 2018-06-13 MED ORDER — INSULIN ASPART 100 UNIT/ML ~~LOC~~ SOLN
0.0000 [IU] | SUBCUTANEOUS | Status: DC
  Administered 2018-06-13: 2 [IU] via SUBCUTANEOUS
  Administered 2018-06-13: 5 [IU] via SUBCUTANEOUS
  Administered 2018-06-14: 3 [IU] via SUBCUTANEOUS
  Administered 2018-06-14: 1 [IU] via SUBCUTANEOUS
  Administered 2018-06-16: 2 [IU] via SUBCUTANEOUS
  Administered 2018-06-17 (×2): 1 [IU] via SUBCUTANEOUS

## 2018-06-13 MED ORDER — SODIUM CHLORIDE 0.9% FLUSH: 3.0000 mL | INTRAVENOUS | Status: DC | PRN

## 2018-06-13 MED ORDER — NICOTINE 21 MG/24HR TD PT24
21.0000 mg | MEDICATED_PATCH | Freq: Every day | TRANSDERMAL | Status: DC
  Administered 2018-06-13 – 2018-06-17 (×5): 21 mg via TRANSDERMAL
  Filled 2018-06-13 (×5): qty 1

## 2018-06-13 MED ORDER — QUETIAPINE FUMARATE 50 MG PO TABS
100.0000 mg | ORAL_TABLET | Freq: Every day | ORAL | Status: DC
  Administered 2018-06-14 – 2018-06-16 (×4): 100 mg via ORAL
  Filled 2018-06-13 (×4): qty 2

## 2018-06-13 MED ORDER — IOPAMIDOL (ISOVUE-300) INJECTION 61%
INTRAVENOUS | Status: AC
  Administered 2018-06-13: 17:00:00
  Filled 2018-06-13: qty 30

## 2018-06-13 NOTE — Progress Notes (Signed)
Pt keeps on jumping out of bed roaming around in room and hallway. Pt keep on wanting to leave or go out to smoke. Pt prn adm ineffective. MD notified. Will continue to closely monitor. Delia Heady RN

## 2018-06-13 NOTE — Progress Notes (Signed)
RN had left pt room to attend to another pt when pt alarm went off; NT went to room and found pt on floor. VSS; MD notified; Charge RN notified and in room to assess pt. Pt daughter [name] notified; pt sustained ST to left forearm; foam dsg applied by charge RN. Pt brought out to in recliner to sit at nursing station. Pt very irritable and pulling on IV wanting to remove; keeps on trying to get out of recliner. New order received for safety sitter at bedside. Reported off to oncoming RN. Delia Heady RN

## 2018-06-13 NOTE — Progress Notes (Signed)
Telemetry called to inform RN of pt having 3beats run of Tanna Furry; MD on unit notified. No new orders received; pt asymptomatic and remains comfortable in bed. Will continue to closely monitor. Delia Heady RN

## 2018-06-13 NOTE — Telephone Encounter (Signed)
Sara Silva, please call patient's daughter and let her know that I am more than happy to see patient but per practice she needs a referral from her primary care to [facility]. Also when patient is in the hospital please insist that the in-patient neurologist see the patient, this is very important. Thanks.    Email receievd: Juluis Rainier This pt:MRN: [id number] Sara Silva (which is not an established pt here) her daughter Sara Silva called stating that you agreed to take her mother on as a pt. Sara Silva has called wanting to let you know she has been advised to have her mother([name], [name]) transported to [facility] via ambulance due to all that is going on with her. Sara Silva has not requested a call back but just wanted you to be aware of what was going on with her mother. Sara Silva states if you would like, you can call her at [phone] or her daughter Sara Silva @ [phone]

## 2018-06-13 NOTE — Consult Note (Signed)
Reason for Consult:brain tumor Referring Physician: kim  Sara Silva is an 77 y.o. female.  HPI: with a history of a temporal lobe abnormality and altered progressively worsening mental status.   Past Medical History:  Diagnosis Date  . Chronic back pain   . COPD (chronic obstructive pulmonary disease) ([facility])    has o2 at home  . Dementia   . Diabetes mellitus   . High cholesterol   . Hypercholesteremia   . Nervous breakdown     Past Surgical History:  Procedure Laterality Date  . BIOPSY  01/30/2016   Procedure: BIOPSY;  Surgeon: Daneil Dolin, MD;  Location: [facility];  Service: Endoscopy;;  . CHOLECYSTECTOMY    . COLONOSCOPY  10/16/2005   [place], [place]- no polyps or inflammatory disease, normal tcs.  . COLONOSCOPY WITH PROPOFOL N/A 01/30/2016   [id number] diverticulosis/redundant colon s/p bx  . ESOPHAGOGASTRODUODENOSCOPY (EGD) WITH PROPOFOL N/A 01/30/2016   [id number] gastric mucosa s/p bx  . ESOPHAGOGASTRODUODENOSCOPY ENDOSCOPY  02/26/11   Dr.Shifflet- small hiatla hernia, normal stomach except antal gastritis. esophageal bx= benign squamous mucosa, antrum bx= mild chronic gastrophathy.   Marland Kitchen HIP SURGERY Right    pinning    Family History  Problem Relation Age of Onset  . Ovarian cancer Sister   . Colon cancer Neg Hx     Social History:  reports that she has been smoking cigarettes.  She has a 30.00 pack-year smoking history. She has never used smokeless tobacco. She reports that she does not drink alcohol or use drugs.  Allergies:  Allergies  Allergen Reactions  . Sulfonamide Derivatives Hives  . Dilaudid [Hydromorphone Hcl] Other (See Comments)    Too sedating, almost stops breathing    Medications: I have reviewed the patient's current medications.  Results for orders placed or performed during the hospital encounter of 06/12/18 (from the past 48 hour(s))  Ammonia     Status: None   Collection Time: 06/12/18  7:24 PM  Result Value Ref Range    Ammonia 28 9 - 35 umol/L    Comment: Performed at [facility], [place] [address] [place], [place] [address]  Comprehensive metabolic panel     Status: Abnormal   Collection Time: 06/12/18  7:24 PM  Result Value Ref Range   Sodium 140 135 - 145 mmol/L   Potassium 3.6 3.5 - 5.1 mmol/L   Chloride 106 98 - 111 mmol/L    Comment: Please note change in reference range.   CO2 24 22 - 32 mmol/L   Glucose, Bld 91 70 - 99 mg/dL    Comment: Please note change in reference range.   BUN 16 8 - 23 mg/dL    Comment: Please note change in reference range.   Creatinine, Ser 0.61 0.44 - 1.00 mg/dL   Calcium 9.1 8.9 - 10.3 mg/dL   Total Protein 5.7 (L) 6.5 - 8.1 g/dL   Albumin 3.4 (L) 3.5 - 5.0 g/dL   AST 16 15 - 41 U/L   ALT 11 0 - 44 U/L    Comment: Please note change in reference range.   Alkaline Phosphatase 66 38 - 126 U/L   Total Bilirubin 0.5 0.3 - 1.2 mg/dL   GFR calc non Af Amer >60 >60 mL/min   GFR calc Af Amer >60 >60 mL/min    Comment: (NOTE) The eGFR has been calculated using the CKD EPI equation. This calculation has not been validated in all clinical situations. eGFR's persistently <60 mL/min  signify possible Chronic Kidney Disease.    Anion gap 10 5 - 15    Comment: Performed at [facility] [address], [place], [place] [address]  Ethanol     Status: None   Collection Time: 06/12/18  7:24 PM  Result Value Ref Range   Alcohol, Ethyl (B) <10 <10 mg/dL    Comment: (NOTE) Lowest detectable limit for serum alcohol is 10 mg/dL. For medical purposes only. Performed at [facility], [place] [address] [place], [place] [address]   CBC WITH DIFFERENTIAL     Status: Abnormal   Collection Time: 06/12/18  7:24 PM  Result Value Ref Range   WBC 8.1 4.0 - 10.5 K/uL   RBC 3.72 (L) 3.87 - 5.11 MIL/uL   Hemoglobin 11.9 (L) 12.0 - 15.0 g/dL   HCT 37.9 36.0 - 46.0 %   MCV 101.9 (H) 78.0 - 100.0 fL   MCH 32.0 26.0 - 34.0 pg   MCHC 31.4 30.0 - 36.0 g/dL   RDW 13.2 11.5 -  15.5 %   Platelets 354 150 - 400 K/uL   Neutrophils Relative % 58 %   Neutro Abs 4.7 1.7 - 7.7 K/uL   Lymphocytes Relative 29 %   Lymphs Abs 2.4 0.7 - 4.0 K/uL   Monocytes Relative 8 %   Monocytes Absolute 0.6 0.1 - 1.0 K/uL   Eosinophils Relative 4 %   Eosinophils Absolute 0.3 0.0 - 0.7 K/uL   Basophils Relative 1 %   Basophils Absolute 0.1 0.0 - 0.1 K/uL   Immature Granulocytes 0 %   Abs Immature Granulocytes 0.0 0.0 - 0.1 K/uL    Comment: Performed at [facility], [place] [address] [place], [place] [address]  I-Stat CG4 Lactic Acid, ED     Status: None   Collection Time: 06/12/18  7:38 PM  Result Value Ref Range   Lactic Acid, Venous 0.63 0.5 - 1.9 mmol/L  I-Stat Chem 8, ED     Status: Abnormal   Collection Time: 06/12/18  7:39 PM  Result Value Ref Range   Sodium 139 135 - 145 mmol/L   Potassium 4.3 3.5 - 5.1 mmol/L   Chloride 104 98 - 111 mmol/L   BUN 22 8 - 23 mg/dL   Creatinine, Ser 0.50 0.44 - 1.00 mg/dL   Glucose, Bld 88 70 - 99 mg/dL   Calcium, Ion 1.12 (L) 1.15 - 1.40 mmol/L   TCO2 28 22 - 32 mmol/L   Hemoglobin 12.2 12.0 - 15.0 g/dL   HCT 36.0 36.0 - 46.0 %  I-Stat venous blood gas, ED     Status: Abnormal   Collection Time: 06/12/18  7:40 PM  Result Value Ref Range   pH, Ven 7.382 7.250 - 7.430   pCO2, Ven 43.4 (L) 44.0 - 60.0 mmHg   pO2, Ven 58.0 (H) 32.0 - 45.0 mmHg   Bicarbonate 25.8 20.0 - 28.0 mmol/L   TCO2 27 22 - 32 mmol/L   O2 Saturation 89.0 %   Patient temperature HIDE    Sample type VENOUS   CBG monitoring, ED     Status: Abnormal   Collection Time: 06/13/18  1:04 AM  Result Value Ref Range   Glucose-Capillary 100 (H) 70 - 99 mg/dL  Glucose, capillary     Status: Abnormal   Collection Time: 06/13/18  5:00 AM  Result Value Ref Range   Glucose-Capillary 113 (H) 70 - 99 mg/dL  Glucose, capillary  Status: Abnormal   Collection Time: 06/13/18 10:26 AM  Result Value Ref Range   Glucose-Capillary 157 (H) 70 - 99 mg/dL    Dg Chest 2  View  Result Date: 06/12/2018 CLINICAL DATA:  Altered mental status, current smoker. EXAM: CHEST - 2 VIEW COMPARISON:  05/21/2018 FINDINGS: Heart size is normal. Moderate aortic atherosclerosis without aneurysm. The lungs are hyperinflated without pulmonary consolidation, effusion or pneumothorax. The right lateral costophrenic angle is obscured by the patient's overlying arm. No acute osseous abnormality. IMPRESSION: Hyperinflated lungs without active pulmonary disease. Electronically Signed   By: Ashley Royalty M.D.   On: 06/12/2018 19:30   Ct Head W Or Wo Contrast  Result Date: 06/12/2018 CLINICAL DATA:  77 y/o  F; headache, malignancy suspected. EXAM: CT HEAD WITHOUT AND WITH CONTRAST TECHNIQUE: Contiguous axial images were obtained from the base of the skull through the vertex without and with intravenous contrast CONTRAST:  28m OMNIPAQUE IOHEXOL 300 MG/ML  SOLN COMPARISON:  05/21/2018 CT head.  05/21/2018 MRI head. FINDINGS: Brain: Left anterior temporal lobe intra-axial ring-enhancing lesion measuring 14 x 16 x 16 mm (AP x ML x CC series 5, image 9 and series 8, image 38). Severe edema throughout the left temporal lobe extending into the posterior insula progressed from the prior CT of the head. Associated mass effect with 4 mm of left-to-right midline shift and left uncal herniation. 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 Stable chronic microvascular ischemic changes and parenchymal volume loss of the brain. No downward herniation, extra-axial collection, or effacement of basilar cisterns. Vascular: No hyperdense vessel or unexpected calcification. Visible vessels are patent. Skull: Normal. Negative for fracture or focal lesion. Sinuses/Orbits: No acute finding. Other: None. IMPRESSION: Left anterior temporal lobe ring-enhancing lesion measuring 16 mm with increased vasogenic edema. Associated mass effect with 4 mm left-to-right midline shift and mild left uncal herniation. The  lesion likely represents a metastasis or abscess. High-grade primary CNS neoplasm could also have this appearance. These results were called by telephone at the time of interpretation on 06/12/2018 at 9:23 pm to Dr. DDeno Etienne, who verbally acknowledged these results. Electronically Signed   By: LKristine GarbeM.D.   On: 06/12/2018 21:24    Review of Systems  Constitutional: Positive for malaise/fatigue.  Skin: Negative.   Neurological: Positive for speech change.       Confusion  Psychiatric/Behavioral: Positive for memory loss.   Blood pressure 126/72, pulse (!) 111, temperature 98.2 F (36.8 C), temperature source Oral, resp. rate 20, weight 42.6 kg (93 lb 14.7 oz), SpO2 96 %. Physical Exam  Constitutional:  cachetic  Neurological: She is alert. She has normal strength and normal reflexes. No cranial nerve deficit. Abnormal coordination: gait not assessed. GCS eye subscore is 4. GCS verbal subscore is 5. GCS motor subscore is 6.  Oriented to person, follows commands Fluent speech,  Perrl, full eom No drift Moving all extremities well Clearly has light touch, cannot perform detailed exam  Skin: Skin is warm and dry.  will need metastatic workup. Will have to speak with daughters and family. Do not understand decision for palliative care. No diagnosis has been made. Will await mri, and the chest abdomen pelvis. Most likely if this is a single met, radiation will be best option.   Assessment/Plan: As above  Runette Scifres L 06/13/2018, 11:14 AM

## 2018-06-13 NOTE — Evaluation (Signed)
Physical Therapy Evaluation Patient Details Name: Sara Silva MRN: [id number] DOB: [birth year] Today's Date: 06/13/2018   History of Present Illness  Patient is a 77 y/o female presenting with worsening confusion. Per chart, ptient diagnosed with brain tumor in mid June. CT revealing Left anterior temporal lobe ring-enhancing lesion measuring 16 mm with increased vasogenic edema. Associated mass effect with 4 mm left-to-right midline shift and mild left uncal herniation. Patient with a PMH significant for hypertension, hyperlipidemia, Dm2, Copd not on home o2, Dementia.    Clinical Impression  Sara Silva is a pleasantly confused 77 y/o female admitted with the above listed diagnosis. Patients daughter providing PLOF as patient is a poor historian - prior to admission patient lived at home with son and was primary caregiver for him. Patient now requiring Min A/Min guard for all transfers and mobility for general safety and steadying. Verbal cueing throughout for safety awareness and obstacle navigation with limited carryover. Recommending SNF at this time due to reduced safe and independent functional mobility placing patient at high risk for falls. PT to continue to follow acutely to maximize safety, balance, functional mobility, strength.     Follow Up Recommendations SNF;Supervision/Assistance - 24 hour    Equipment Recommendations  Other (comment)(TBD)    Recommendations for Other Services       Precautions / Restrictions Precautions Precautions: Fall Restrictions Weight Bearing Restrictions: No      Mobility  Bed Mobility Overal bed mobility: Needs Assistance Bed Mobility: Supine to Sit;Sit to Supine     Supine to sit: Min assist Sit to supine: Min guard   General bed mobility comments: Min A for LE management and to begin mobility as patient difficult with following commands  Transfers Overall transfer level: Needs assistance   Transfers: Sit to/from Stand Sit to  Stand: Min guard         General transfer comment: for safety and immediate standing balance  Ambulation/Gait Ambulation/Gait assistance: Min guard Gait Distance (Feet): 100 Feet Assistive device: 1 person hand held assist Gait Pattern/deviations: Step-through pattern;Decreased stride length;Trunk flexed;Narrow base of support Gait velocity: decreased   General Gait Details: slight instability throughout gait; verbal cueing for safety awareness and obstacle navigation with limited carryover  Stairs            Wheelchair Mobility    Modified Rankin (Stroke Patients Only)       Balance Overall balance assessment: Needs assistance Sitting-balance support: No upper extremity supported;Feet supported Sitting balance-Leahy Scale: Fair     Standing balance support: Single extremity supported;During functional activity Standing balance-Leahy Scale: Poor                               Pertinent Vitals/Pain Pain Assessment: No/denies pain    Home Living Family/patient expects to be discharged to:: Private residence Living Arrangements: Children Available Help at Discharge: Family Type of Home: House Home Access: Level entry     Home Layout: Two level;Able to live on main level with bedroom/bathroom Home Equipment: Wheelchair - manual Additional Comments: patients daughter providing PLOF as able     Prior Function Level of Independence: Independent         Comments: daughter reports 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     Hand Dominance        Extremity/Trunk Assessment        Lower Extremity Assessment Lower Extremity Assessment: Generalized weakness    Cervical /  Trunk Assessment Cervical / Trunk Assessment: Kyphotic  Communication   Communication: No difficulties  Cognition Arousal/Alertness: Awake/alert Behavior During Therapy: WFL for tasks assessed/performed Overall Cognitive Status: Impaired/Different from  baseline Area of Impairment: Orientation;Memory;Following commands;Safety/judgement;Problem solving                 Orientation Level: Disoriented to;Place;Time;Situation   Memory: Decreased recall of precautions;Decreased short-term memory Following Commands: Follows one step commands with increased time Safety/Judgement: Decreased awareness of safety;Decreased awareness of deficits   Problem Solving: Slow processing;Decreased initiation;Difficulty sequencing;Requires verbal cues;Requires tactile cues General Comments: pleasantly confused      General Comments      Exercises     Assessment/Plan    PT Assessment Patient needs continued PT services  PT Problem List Decreased strength;Decreased activity tolerance;Decreased balance;Decreased mobility;Decreased cognition;Decreased knowledge of use of DME;Decreased safety awareness;Decreased knowledge of precautions       PT Treatment Interventions DME instruction;Gait training;Stair training;Functional mobility training;Therapeutic activities;Therapeutic exercise;Balance training;Neuromuscular re-education;Cognitive remediation;Patient/family education    PT Goals (Current goals can be found in the Care Plan section)  Acute Rehab PT Goals Patient Stated Goal: none stated PT Goal Formulation: With patient Time For Goal Achievement: 06/27/18 Potential to Achieve Goals: Fair    Frequency Min 3X/week   Barriers to discharge        Co-evaluation               AM-PAC PT "6 Clicks" Daily Activity  Outcome Measure Difficulty turning over in bed (including adjusting bedclothes, sheets and blankets)?: A Little Difficulty moving from lying on back to sitting on the side of the bed? : Unable Difficulty sitting down on and standing up from a chair with arms (e.g., wheelchair, bedside commode, etc,.)?: Unable Help needed moving to and from a bed to chair (including a wheelchair)?: A Little Help needed walking in hospital  room?: A Little Help needed climbing 3-5 steps with a railing? : A Lot 6 Click Score: 13    End of Session Equipment Utilized During Treatment: Gait belt Activity Tolerance: Patient tolerated treatment well Patient left: in bed;with call bell/phone within reach;with bed alarm set;with family/visitor present Nurse Communication: Mobility status PT Visit Diagnosis: Unsteadiness on feet (R26.81);Other abnormalities of gait and mobility (R26.89);Muscle weakness (generalized) (M62.81)    Time: 9798-9211 PT Time Calculation (min) (ACUTE ONLY): 30 min   Charges:   PT Evaluation $PT Eval Moderate Complexity: 1 Mod PT Treatments $Therapeutic Activity: 8-22 mins   PT G Codes:        Lanney Gins, PT, DPT 06/13/18 3:23 PM Pager: [phone]

## 2018-06-13 NOTE — Progress Notes (Signed)
Palliative Medicine RN Note: Consult order noted. Reviewed available notes. Dr Julianne Rice note & Neuro RN note both indicate that family would like hospice, and Dr Maudie Mercury indicates she will be d/c today.  If Chiamaka's family would like to pursue hospice care, this can be set up by either RNCM (for home hospice) or SW (for inpatient hospice). We will likely have a provider available tomorrow.  Marjie Skiff Quan Cybulski, RN, BSN, [facility] Palliative Medicine Team 06/13/2018 10:34 AM Office [phone]

## 2018-06-13 NOTE — Progress Notes (Signed)
Patient seen and examined this morning, admitted by Dr. Buena Irish overnight H&P reviewed. In brief, this is 77 year old female with hypertension, hyperlipidemia, diabetes, COPD as well as dementia who came in with worsening confusion and agitation per her daughter.  In the ED CT of the brain showed a ring-enhancing lesion about 16 mm with vasogenic edema as well as mass-effect with 4 mm left to right midline shift and mild left uncal herniation.  Neurosurgery was consulted and she was admitted to the hospital.   Brain mass -Neurosurgery consulted, discussed with Dr. Christella Noa, he will evaluate patient -If this is an operable/not further candidate for treatment then she may benefit from hospice. -Continue IV Decadron  Diabetes mellitus -Placed on sliding scale, she is on Decadron for #1 closely monitor CBGs  Dementia -continue Seroquel, continue lithium   Costin M. Cruzita Lederer, MD [facility] [phone]  If 7PM-7AM, please contact night-coverage [email or web address] Password [email or web address]

## 2018-06-13 NOTE — Progress Notes (Signed)
Initial Nutrition Assessment  DOCUMENTATION CODES:   Severe malnutrition in context of chronic illness, Underweight  INTERVENTION:  - Will order Ensure Enlive BID, each supplement provides 350 kcal and 20 grams of protein. - Will order daily multivitamin with minerals. - Diet advancement if medically feasible. - Will monitor POC/GOC.    NUTRITION DIAGNOSIS:   Severe Malnutrition related to chronic illness(dementia) as evidenced by severe muscle depletion, severe fat depletion.  GOAL:   Patient will meet greater than or equal to 90% of their needs  MONITOR:   PO intake, Supplement acceptance, Weight trends, Labs, Other (Comment)(POC/GOC)  REASON FOR ASSESSMENT:   Malnutrition Screening Tool  ASSESSMENT:   77 y.o. female, w hypertension, hyperlipidemia, Dm2, Copd not on home o2, Dementia apparently presents with AMS, more agitation and memory loss per her daughter.  No intakes documented since admission. Patient confused and does not appropriately respond to questions asked. Sister at bedside and reports that patient had grits, ice cream, and coffee for breakfast. She requests another cup of coffee for patient which RD provided. Brief visit with patient and sister and unable to obtain PTA nutrition-related information.   NFPE outlined below. Per review of Care Everywhere: patient weighed 103 lbs at [facility] on 09/16/17. Based on current weight, this indicates 10 lb weight loss (9.7% body weight) in the past 9 months. This is not significant for time frame.   Per Dr. Chrissie Noa note this AM: patient with brain mass and Neurosurgery consulted. Plan is likely for home with hospice.   Medications reviewed; sliding scale Novolog.  Labs reviewed; CBGs: 100 and 113 mg/dL, ionized Ca: 1.12 mmol/L.      NUTRITION - FOCUSED PHYSICAL EXAM:    Most Recent Value  Orbital Region  Moderate depletion  Upper Arm Region  Severe depletion  Thoracic and Lumbar Region  Severe depletion  Buccal  Region  Moderate depletion  Temple Region  Moderate depletion  Clavicle Bone Region  Severe depletion  Clavicle and Acromion Bone Region  Severe depletion  Scapular Bone Region  Moderate depletion  Dorsal Hand  Severe depletion  Patellar Region  Unable to assess  Anterior Thigh Region  Unable to assess  Posterior Calf Region  Unable to assess  Edema (RD Assessment)  Unable to assess  Hair  Reviewed  Eyes  Reviewed  Mouth  Reviewed  Skin  Reviewed  Nails  Reviewed       Diet Order:   Diet Order           Diet full liquid Room service appropriate? Yes; Fluid consistency: Thin  Diet effective now          EDUCATION NEEDS:   No education needs have been identified at this time  Skin:  Skin Assessment: Reviewed RN Assessment  Last BM:  PTA/unknown  Height:   Ht Readings from Last 1 Encounters:  03/23/16 5\' 2"  (1.575 m)    Weight:   Wt Readings from Last 1 Encounters:  06/13/18 93 lb 14.7 oz (42.6 kg)    Ideal Body Weight:  50 kg  BMI:  Body mass index is 17.18 kg/m.  Estimated Nutritional Needs:   Kcal:  1490-1705 (35-40 kcal/kg)  Protein:  65-72 grams (1.5-1.7 grams/kg)  Fluid:  >/= 1.5 L/day     Jarome Matin, MS, RD, LDN, [facility] Inpatient Clinical Dietitian Pager # [phone] After hours/weekend pager # [phone]

## 2018-06-13 NOTE — Progress Notes (Addendum)
Discussed at bedside in the morning and afternoon with several family members including patient's daughter, POA [name] who arrived around 2 pm. She tells me that mid June patient was diagnosed with a brain mass at [facility] ED and was directed to follow up with neurology as an outpatient. Patient however has been persistently refusing to see any doctor up until now when because of AMS she was brought to the ED. Family is under the impression that patient has "stomach cancer" but they are not sure where that came from.   One of the daughters this morning was extremely upset with me regarding the fact that she had to wait 17 hours in the ED and she is not sure what is going on.   Explained to the family that neurosurgery has been consulted and we are currently doing a workup as to what the brain tumor may represent.   Given prior patient's wishes not to have follow up last month and almost a month has passed from the diagnosis, family dynamics, high potential for malignancy I have consulted palliative care to help with symptom management and emotional support for the patient and family. In addition, patient's udnerlying dementia and ongoing agitation (persistently trying to get out of bed to go out and smoke) may affect her capacity to successfully participate in potential therapies.   Costin M. Cruzita Lederer, MD [facility] [phone]  If 7PM-7AM, please contact night-coverage [email or web address] Password [email or web address]

## 2018-06-13 NOTE — Telephone Encounter (Signed)
I called Sara Sara Silva number and her husband answered. He states that Sara Silva is with her mom in [place]. [facility]. 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 She has a midline shift on the brain 53mm to right. They have been advised that there is nothing medical/treatment wise they can offer. The husband states the plan would be they dc home with hospice. I offered our sympathies from Dr Sara Silva and I. I did advise that if they felt the patient needed to still be seen by Dr Sara Silva the PCP would have to place a referral to our office but from the sounds of it this may not be necessary at this time. Advised Sara Silva to have Sara Silva contact us if there is anything else needed from [place].

## 2018-06-14 ENCOUNTER — Inpatient Hospital Stay (HOSPITAL_COMMUNITY): Payer: Medicare Other

## 2018-06-14 DIAGNOSIS — R451 Restlessness and agitation: Secondary | ICD-10-CM

## 2018-06-14 DIAGNOSIS — Z515 Encounter for palliative care: Secondary | ICD-10-CM

## 2018-06-14 DIAGNOSIS — Z7189 Other specified counseling: Secondary | ICD-10-CM

## 2018-06-14 LAB — GLUCOSE, CAPILLARY
GLUCOSE-CAPILLARY: 119 mg/dL — AB (ref 70–99)
GLUCOSE-CAPILLARY: 125 mg/dL — AB (ref 70–99)
Glucose-Capillary: 224 mg/dL — ABNORMAL HIGH (ref 70–99)
Glucose-Capillary: 76 mg/dL (ref 70–99)
Glucose-Capillary: 94 mg/dL (ref 70–99)

## 2018-06-14 LAB — URINE CULTURE: Culture: NO GROWTH

## 2018-06-14 MED ORDER — CLONAZEPAM 0.5 MG PO TABS
0.5000 mg | ORAL_TABLET | Freq: Every day | ORAL | Status: DC
  Administered 2018-06-14: 0.5 mg via ORAL
  Filled 2018-06-14: qty 1

## 2018-06-14 MED ORDER — DIVALPROEX SODIUM 125 MG PO CSDR
250.0000 mg | DELAYED_RELEASE_CAPSULE | Freq: Three times a day (TID) | ORAL | Status: DC
  Administered 2018-06-14 – 2018-06-16 (×8): 250 mg via ORAL
  Filled 2018-06-14 (×9): qty 2

## 2018-06-14 MED ORDER — GADOBENATE DIMEGLUMINE 529 MG/ML IV SOLN
9.0000 mL | Freq: Once | INTRAVENOUS | Status: AC | PRN
  Administered 2018-06-14: 9 mL via INTRAVENOUS

## 2018-06-14 NOTE — Consult Note (Signed)
Consultation Note Date: 06/14/2018   Patient Name: WAFA MARTES  DOB: 1941-11-18  MRN: [id number]  Age / Sex: 77 y.o., female  PCP: Marjo Bicker, MD Referring Physician: Caren Griffins, MD  Reason for Consultation: Establishing goals of care and Psychosocial/spiritual support  HPI/Patient Profile: 77 y.o. female  with past medical history of bipolar disorder, hyperlipidemia, COPD, chronic back pain, hypertension, dementia admitted on 06/12/2018 with confusion, altered mental status, agitation.  Patient was recently diagnosed with a brain tumor in June 2019.  Per MRI scan patient has left temporal lobe, ring-enhancing lesion; primary CNS versus metastatic disease and/or abscess.  Neurosurgery has been consulted .    Consult ordered for goals of care  Clinical Assessment and Goals of Care: Met with patient, chart reviewed.  Spoke to daughters [name] who is healthcare POA by report, and other daughter, Joanna Puff. Mtg held with all dtr's Kathryne Hitch, and Butch Penny Patient has a total of 4 children.  Her son, has cerebral palsy, and Mrs. Mcphee has been the primary caregiver of him. Per 3 daughters, they feel  their mother would not want to have any surgery or radiation treatment for this malignancy.  They are in agreement to forgo any further work up   Unfortunately, Ms Hazan is unable to speak for herself in terms of healthcare decisions at this point.  Her daughter, [name] at [phone], is reportedly the healthcare POA.  She does states she is making decisions with her siblings, specifically her sister, Joanna Puff at [phone] and [place]   Confirmed DNR/DNI DC to [facility] SNF in [place], [place]. Consult placed to social work Family wants comfort care at the facility but would like to see her symptoms better managed while here at the hospital No  further work up for CNS lesion including CT of chest, abdomen and pelvis. Will DC that order Will start medication for agitation. See below Code Status/Advance Care Planning:  DNR    Symptom Management:   Agitation: Per daughter [name], patient has been on Klonopin 0.5 mg twice daily for "years".  Would recommend resuming this to avoid benzodiazepine withdrawal.  Another option to help with patient's level of agitation, psychomotor restlessness could be Depakote 250 mg 3 times daily.  This is a relatively low dose in terms of managing bipolar disorder and also would  not  increase patient's seizure risk.  Per daughter's, she has not taken this medicine in the past and there are no known adverse reactions  Will also add decadron 39m daily for vasogenic edema 2/2 CNS lesion. May need to up titrate Depakote and klonopin is steroids cause worsening anxiety  Palliative Prophylaxis:   Aspiration, Bowel Regimen, Delirium Protocol, Frequent Pain Assessment, Oral Care and Turn Reposition  Additional Recommendations (Limitations, Scope, Preferences):  No Surgical Procedures  Psycho-social/Spiritual:   Desire for further Chaplaincy support:no  Additional Recommendations: Referral to Community Resources   Prognosis:   < 3 months in the setting of high  grade CNS lesion  Discharge Planning: [place] with Hospice      Primary Diagnoses: Present on Admission: . Brain tumor ([place]) . Tachycardia . Altered mental status . Brain metastases ([place])   I have reviewed the medical record, interviewed the patient and family, and examined the patient. The following aspects are pertinent.  Past Medical History:  Diagnosis Date  . Chronic back pain   . COPD (chronic obstructive pulmonary disease) (H[facility])    has o2 at home  . Dementia   . Diabetes mellitus   . High cholesterol   . Hypercholesteremia   . Nervous breakdown    Social History   Socioeconomic History  . Marital  status: Widowed    Spouse name: Not on file  . Number of children: Not on file  . Years of education: Not on file  . Highest education level: Not on file  Occupational History  . Not on file  Social Needs  . Financial resource strain: Not on file  . Food insecurity:    Worry: Not on file    Inability: Not on file  . Transportation needs:    Medical: Not on file    Non-medical: Not on file  Tobacco Use  . Smoking status: Current Every Day Smoker    Packs/day: 1.00    Years: 30.00    Pack years: 30.00    Types: Cigarettes    Last attempt to quit: 01/17/2016    Years since quitting: 2.4  . Smokeless tobacco: Never Used  . Tobacco comment: one half pack daily  Substance and Sexual Activity  . Alcohol use: No    Alcohol/week: 0.0 oz  . Drug use: No  . Sexual activity: Not on file  Lifestyle  . Physical activity:    Days per week: Not on file    Minutes per session: Not on file  . Stress: Not on file  Relationships  . Social connections:    Talks on phone: Not on file    Gets together: Not on file    Attends religious service: Not on file    Active member of club or organization: Not on file    Attends meetings of clubs or organizations: Not on file    Relationship status: Not on file  Other Topics Concern  . Not on file  Social History Narrative  . Not on file   Family History  Problem Relation Age of Onset  . Ovarian cancer Sister   . Colon cancer Neg Hx    Scheduled Meds: . feeding supplement (ENSURE ENLIVE)  237 mL Oral BID BM  . insulin aspart  0-9 Units Subcutaneous Q4H  . lithium carbonate  300 mg Oral Daily  . multivitamin with minerals  1 tablet Oral Daily  . nicotine  21 mg Transdermal Daily  . pantoprazole  40 mg Oral Daily  . QUEtiapine  100 mg Oral QHS  . sodium chloride flush  3 mL Intravenous Q12H   Continuous Infusions: . sodium chloride     PRN Meds:.sodium chloride, acetaminophen **OR** acetaminophen, LORazepam, sodium chloride  flush Medications Prior to Admission:  Prior to Admission medications   Medication Sig Start Date End Date Taking? Authorizing Provider  aspirin EC 81 MG tablet Take 81 mg by mouth daily.   Yes [provider]  Calcium Carbonate Antacid (TUMS PO) Take 1 tablet by mouth daily as needed (heartburn).    Yes [provider]  clonazePAM (KLONOPIN) 0.5 MG tablet Take 1  tablet (0.5 mg total) by mouth daily as needed for anxiety. 05/25/18  Yes Norman Clay, MD  lithium 300 MG tablet Take 1 tablet (300 mg total) by mouth daily. 05/16/18 09/13/18 Yes Hisada, Elie Goody, MD  nitroGLYCERIN (NITROSTAT) 0.4 MG SL tablet Place 0.4 mg under the tongue every 5 (five) minutes as needed for chest pain.   Yes [provider]  omeprazole (PRILOSEC) 20 MG capsule Take 1 capsule (20 mg total) by mouth daily before breakfast. 03/23/16  Yes Mahala Menghini, PA-C  QUEtiapine (SEROQUEL) 100 MG tablet Take 1 tablet (100 mg total) by mouth at bedtime. 05/16/18  Yes Norman Clay, MD   Allergies  Allergen Reactions  . Sulfonamide Derivatives Hives  . Dilaudid [Hydromorphone Hcl] Other (See Comments)    Too sedating, almost stops breathing   Review of Systems  Unable to perform ROS: Acuity of condition    Physical Exam  Constitutional:  Acutely ill appearing elderly female; psychomotor restlessness, anxious Cachexic   HENT:  Head: Normocephalic and atraumatic.  Neck: Normal range of motion.  Cardiovascular: Normal rate.  Pulmonary/Chest: Effort normal.  Skin: Skin is warm and dry.  Psychiatric:  Psychomotor restlessness oriented to self only. Difficult to redirect Poor insight  Nursing note and vitals reviewed.   Vital Signs: BP 136/73 (BP Location: Left Leg)   Pulse 79   Temp (!) 97.4 F (36.3 C) (Axillary)   Resp 18   Wt 42.6 kg (93 lb 14.7 oz)   SpO2 99%   BMI 17.18 kg/m  Pain Scale: 0-10   Pain Score: Asleep   SpO2: SpO2: 99 % O2 Device:SpO2: 99 % O2 Flow Rate: .    IO: Intake/output summary:   Intake/Output Summary (Last 24 hours) at 06/14/2018 0941 Last data filed at 06/14/2018 0215 Gross per 24 hour  Intake -  Output 650 ml  Net -650 ml    LBM: Last BM Date: 06/12/18 Baseline Weight: Weight: 42.6 kg (93 lb 14.7 oz) Most recent weight: Weight: 42.6 kg (93 lb 14.7 oz)     Palliative Assessment/Data:   Flowsheet Rows     Most Recent Value  Intake Tab  Referral Department  Hospitalist  Unit at Time of Referral  Med/Surg Unit  Palliative Care Primary Diagnosis  Neurology  Date Notified  06/12/18  Palliative Care Type  New Palliative care  Reason for referral  Clarify Goals of Care  Date of Admission  06/12/18  Date first seen by Palliative Care  06/14/18  # of days Palliative referral response time  2 Day(s)  # of days IP prior to Palliative referral  0  Clinical Assessment  Palliative Performance Scale Score  40%  Pain Max last 24 hours  Not able to report  Pain Min Last 24 hours  Not able to report  Dyspnea Max Last 24 Hours  Not able to report  Dyspnea Min Last 24 hours  Not able to report  Nausea Max Last 24 Hours  Not able to report  Nausea Min Last 24 Hours  Not able to report  Anxiety Max Last 24 Hours  Not able to report  Anxiety Min Last 24 Hours  Not able to report  Other Max Last 24 Hours  Not able to report  Psychosocial & Spiritual Assessment  Palliative Care Outcomes  Patient/Family meeting held?  Yes  Who was at the meeting?  dtr's Hilda Blades and [name]  Patient/Family wishes: Interventions discontinued/not started   Mechanical Ventilation  Palliative Care follow-up planned  Yes, Facility      Time In: 1500 Time Out: 1620 Time Total: 80 min Greater than 50%  of this time was spent counseling and coordinating care related to the above assessment and plan. Staffed with Dr. Christella Noa and Dr. Forrest Moron  Signed by: Dory Horn, NP   Please contact Palliative Medicine Team phone at [phone] for questions and  concerns.  For individual provider: See Shea Evans

## 2018-06-14 NOTE — Evaluation (Addendum)
Occupational Therapy Evaluation Patient Details Name: Sara Silva MRN: [id number] DOB: 1941/10/20 Today's Date: 06/14/2018    History of Present Illness Patient is a 77 y.o. female presenting with worsening confusion. Per chart, ptient diagnosed with brain tumor in mid June. CT revealing Left anterior temporal lobe ring-enhancing lesion measuring 16 mm with increased vasogenic edema. Associated mass effect with 4 mm left-to-right midline shift and mild left uncal herniation. Patient with a PMH significant for hypertension, hyperlipidemia, Dm2, Copd not on home o2, Dementia.   Clinical Impression   Pt admitted for above. Pt independent with ADLs, PTA. Feel pt will benefit from acute OT to increase independence prior to d/c. Recommending SNF for d/c.     Follow Up Recommendations  SNF;Supervision/Assistance - 24 hour    Equipment Recommendations  Other (comment)(defer to next venue)    Recommendations for Other Services       Precautions / Restrictions Precautions Precautions: Fall Restrictions Weight Bearing Restrictions: No      Mobility Bed Mobility               General bed mobility comments: not assessed  Transfers Overall transfer level: Needs assistance   Transfers: Sit to/from Stand Sit to Stand: Min guard              Balance        Min guard to walk to bathroom. Lost balance on the way back to her chair and used hand held assist as well.                                    ADL either performed or assessed with clinical judgement   ADL Overall ADL's : Needs assistance/impaired     Grooming: Oral care;Wash/dry hands;Minimal assistance;Standing               Lower Body Dressing: Sit to/from stand;Moderate assistance Lower Body Dressing Details (indicate cue type and reason): pt trying to put sock over other sock. needing cues and OT helped turn sock right side out. Toilet Transfer: Ambulation;Regular Toilet(lost balance  on way back to chair-Mod A) Toilet Transfer Details (indicate cue type and reason): Min guard to ambulate to toilet; Min A when walking to sink Toileting- Water quality scientist and Hygiene: Min guard;Sit to/from stand Toileting - Clothing Manipulation Details (indicate cue type and reason): pt ambulated to sink and washed bottom     Functional mobility during ADLs: Moderate assistance General ADL Comments: Min guard ambulating to toilet. Pt lost balance when on the way back to chair and used hand held assist from OT. Diffuculty with donning sock.     Vision         Perception     Praxis      Pertinent Vitals/Pain Pain Assessment: No/denies pain     Hand Dominance     Extremity/Trunk Assessment Upper Extremity Assessment Upper Extremity Assessment: Overall WFL for tasks assessed   Lower Extremity Assessment Lower Extremity Assessment: Defer to PT evaluation       Communication Communication Communication: No difficulties   Cognition Arousal/Alertness: Awake/alert Behavior During Therapy: WFL for tasks assessed/performed Overall Cognitive Status: No family/caregiver present to determine baseline cognitive functioning Area of Impairment: Orientation;Attention;Safety/judgement;Problem solving;Following commands                 Orientation Level: Disoriented to;Place Current Attention Level: Sustained   Following Commands: Follows one step commands inconsistently;Follows one step  commands with increased time Safety/Judgement: Decreased awareness of safety   Problem Solving: Slow processing;Requires verbal cues     General Comments       Exercises     Shoulder Instructions      Home Living Family/patient expects to be discharged to:: Unsure Living Arrangements: Children Available Help at Discharge: Family Type of Home: House Home Access: Level entry     Home Layout: Two level;Able to live on main level with bedroom/bathroom               Home  Equipment: Wheelchair - manual   Additional Comments: home information taken from PT eval      Prior Functioning/Environment Level of Independence: Independent        Comments: daughter reports 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 per PT eval        OT Problem List: Decreased safety awareness;Decreased cognition;Decreased activity tolerance;Decreased strength;Impaired balance (sitting and/or standing);Decreased knowledge of use of DME or AE;Decreased knowledge of precautions      OT Treatment/Interventions: Self-care/ADL training;DME and/or AE instruction;Therapeutic activities;Cognitive remediation/compensation;Patient/family education;Balance training    OT Goals(Current goals can be found in the care plan section) Acute Rehab OT Goals Patient Stated Goal: none stated OT Goal Formulation: Patient unable to participate in goal setting Time For Goal Achievement: 06/21/18 Potential to Achieve Goals: Fair ADL Goals Pt Will Perform Grooming: with supervision;standing Pt Will Perform Upper Body Bathing: with supervision;standing;sitting Pt Will Perform Upper Body Dressing: with supervision;sitting;standing Pt Will Perform Lower Body Dressing: with supervision;sit to/from stand Pt Will Transfer to Toilet: with supervision;ambulating;regular height toilet  OT Frequency: Min 2X/week   Barriers to D/C:            Co-evaluation              AM-PAC PT "6 Clicks" Daily Activity     Outcome Measure Help from another person eating meals?: A Little Help from another person taking care of personal grooming?: A Little Help from another person toileting, which includes using toliet, bedpan, or urinal?: A Little Help from another person bathing (including washing, rinsing, drying)?: A Little Help from another person to put on and taking off regular upper body clothing?: A Little Help from another person to put on and taking off regular lower body clothing?: A Lot 6 Click  Score: 17   End of Session Equipment Utilized During Treatment: Gait belt  Activity Tolerance: Patient tolerated treatment well Patient left: in chair;with restraints reapplied;with nursing/sitter in room  OT Visit Diagnosis: Unsteadiness on feet (R26.81);Other (comment)(decreased cognition)                Time: 5170-0174 OT Time Calculation (min): 16 min Charges:  OT General Charges $OT Visit: 1 Visit OT Evaluation $OT Eval Moderate Complexity: 1 Mod G-Codes:      Jolean Madariaga L Skyler Carel OTR/L 06/14/2018, 10:32 AM

## 2018-06-14 NOTE — Progress Notes (Signed)
PROGRESS NOTE  ALNITA AYBAR EHU:[id number] DOB: [birth year] DOA: 06/12/2018 PCP: Marjo Bicker, MD   LOS: 2 days   Brief Narrative / Interim history: 77 year old female with hypertension, hyperlipidemia, diabetes, COPD as well as dementia who came in with worsening confusion and agitation per her daughter.  In the ED CT of the brain showed a ring-enhancing lesion about 16 mm with vasogenic edema as well as mass-effect with 4 mm left to right midline shift and mild left uncal herniation.  Neurosurgery was consulted and she was admitted to the hospital.  Assessment & Plan: Principal Problem:   Brain tumor [facility]) Active Problems:   Tachycardia   Altered mental status   Protein-calorie malnutrition, severe   Brain metastases ([place])   Brain mass -Neurosurgery consulted, discussed with Dr. Christella Noa today, patient underwent an MRI of the brain which showed peripherally contrast-enhancing lesion of the anterior left temporal lobe with severe surrounding vasogenic edema, suspicious for primary CNS neoplasm versus solitary metastasis.  It does not appear to be an abscess -Awaiting CT scan of the chest abdomen pelvis -Continue Decadron  Diabetes mellitus -Placed on sliding scale, CBGs fairly well controlled, keep on same regimen  Dementia with behavioral disturbances -continue Seroquel, continue lithium, Ativan -Appreciate palliative assistance  Goals of care -She is DNR, pending work-up as above possible that she has primary CNS malignancy versus metastatic cancer, less likely abscess -Palliative met with the family and does not seem like there willing to pursue further treatment but will complete work-up to figure out the correct diagnosis and potentially what could be done   DVT prophylaxis: SCDs Code Status: DNR Family Communication: no family at bedside today Disposition Plan: TBD  Consultants:   Neurosurgery   Palliative   Procedures:   None    Antimicrobials:  None    Subjective: - no chest pain, shortness of breath, no abdominal pain, nausea or vomiting.   Objective: Vitals:   06/13/18 2008 06/13/18 2351 06/14/18 0317 06/14/18 1152  BP: (!) 100/52 133/67 136/73 (!) 119/52  Pulse: 78 94 79 (!) 105  Resp: 20 20 18 18   Temp: 98 F (36.7 C) 98.2 F (36.8 C) (!) 97.4 F (36.3 C) 97.6 F (36.4 C)  TempSrc: Axillary Axillary Axillary Axillary  SpO2: 100% 99% 99% 99%  Weight:        Intake/Output Summary (Last 24 hours) at 06/14/2018 1424 Last data filed at 06/14/2018 0215 Gross per 24 hour  Intake -  Output 350 ml  Net -350 ml   Filed Weights   06/13/18 0426  Weight: 42.6 kg (93 lb 14.7 oz)    Examination:  Constitutional: NAD, confused Respiratory: CTA Cardiovascular: RRR  Data Reviewed: I have independently reviewed following labs and imaging studies   CBC: Recent Labs  Lab 06/12/18 1924 06/12/18 1939  WBC 8.1  --   NEUTROABS 4.7  --   HGB 11.9* 12.2  HCT 37.9 36.0  MCV 101.9*  --   PLT 354  --    Basic Metabolic Panel: Recent Labs  Lab 06/12/18 1924 06/12/18 1939  NA 140 139  K 3.6 4.3  CL 106 104  CO2 24  --   GLUCOSE 91 88  BUN 16 22  CREATININE 0.61 0.50  CALCIUM 9.1  --    GFR: Estimated Creatinine Clearance: 39 mL/min (by C-G formula based on SCr of 0.5 mg/dL). Liver Function Tests: Recent Labs  Lab 06/12/18 1924  AST 16  ALT 11  ALKPHOS 66  BILITOT  0.5  PROT 5.7*  ALBUMIN 3.4*   No results for input(s): LIPASE, AMYLASE in the last 168 hours. Recent Labs  Lab 06/12/18 1924  AMMONIA 28   Coagulation Profile: No results for input(s): INR, PROTIME in the last 168 hours. Cardiac Enzymes: No results for input(s): CKTOTAL, CKMB, CKMBINDEX, TROPONINI in the last 168 hours. BNP (last 3 results) No results for input(s): PROBNP in the last 8760 hours. HbA1C: No results for input(s): HGBA1C in the last 72 hours. CBG: Recent Labs  Lab 06/13/18 2005 06/13/18 2349  06/14/18 0312 06/14/18 0756 06/14/18 1147  GLUCAP 140* 110* 119* 125* 224*   Lipid Profile: No results for input(s): CHOL, HDL, LDLCALC, TRIG, CHOLHDL, LDLDIRECT in the last 72 hours. Thyroid Function Tests: No results for input(s): TSH, T4TOTAL, FREET4, T3FREE, THYROIDAB in the last 72 hours. Anemia Panel: No results for input(s): VITAMINB12, FOLATE, FERRITIN, TIBC, IRON, RETICCTPCT in the last 72 hours. Urine analysis:    Component Value Date/Time   COLORURINE YELLOW 06/12/2018 1812   APPEARANCEUR CLEAR 06/12/2018 1812   LABSPEC 1.024 06/12/2018 1812   PHURINE 6.0 06/12/2018 1812   GLUCOSEU 150 (A) 06/12/2018 1812   HGBUR NEGATIVE 06/12/2018 1812   BILIRUBINUR NEGATIVE 06/12/2018 1812   KETONESUR NEGATIVE 06/12/2018 1812   PROTEINUR NEGATIVE 06/12/2018 1812   NITRITE NEGATIVE 06/12/2018 1812   LEUKOCYTESUR NEGATIVE 06/12/2018 1812   Sepsis Labs: Invalid input(s): PROCALCITONIN, LACTICIDVEN  Recent Results (from the past 240 hour(s))  Urine culture     Status: None   Collection Time: 06/13/18 12:22 PM  Result Value Ref Range Status   Specimen Description URINE, CLEAN CATCH  Final   Special Requests NONE  Final   Culture   Final    NO GROWTH Performed at [facility], [place] [address], [place], [place] [address]    Report Status 06/14/2018 FINAL  Final      Radiology Studies: Dg Chest 2 View  Result Date: 06/12/2018 CLINICAL DATA:  Altered mental status, current smoker. EXAM: CHEST - 2 VIEW COMPARISON:  05/21/2018 FINDINGS: Heart size is normal. Moderate aortic atherosclerosis without aneurysm. The lungs are hyperinflated without pulmonary consolidation, effusion or pneumothorax. The right lateral costophrenic angle is obscured by the patient's overlying arm. No acute osseous abnormality. IMPRESSION: Hyperinflated lungs without active pulmonary disease. Electronically Signed   By: Ashley Royalty M.D.   On: 06/12/2018 19:30   Ct Head W Or Wo Contrast  Result Date:  06/12/2018 CLINICAL DATA:  77 y/o  F; headache, malignancy suspected. EXAM: CT HEAD WITHOUT AND WITH CONTRAST TECHNIQUE: Contiguous axial images were obtained from the base of the skull through the vertex without and with intravenous contrast CONTRAST:  55m OMNIPAQUE IOHEXOL 300 MG/ML  SOLN COMPARISON:  05/21/2018 CT head.  05/21/2018 MRI head. FINDINGS: Brain: Left anterior temporal lobe intra-axial ring-enhancing lesion measuring 14 x 16 x 16 mm (AP x ML x CC series 5, image 9 and series 8, image 38). Severe edema throughout the left temporal lobe extending into the posterior insula progressed from the prior CT of the head. Associated mass effect with 4 mm of left-to-right midline shift and left uncal herniation. 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 Stable chronic microvascular ischemic changes and parenchymal volume loss of the brain. No downward herniation, extra-axial collection, or effacement of basilar cisterns. Vascular: No hyperdense vessel or unexpected calcification. Visible vessels are patent. Skull: Normal. Negative for fracture or focal lesion. Sinuses/Orbits: No acute finding. Other: None. IMPRESSION: Left anterior temporal  lobe ring-enhancing lesion measuring 16 mm with increased vasogenic edema. Associated mass effect with 4 mm left-to-right midline shift and mild left uncal herniation. The lesion likely represents a metastasis or abscess. High-grade primary CNS neoplasm could also have this appearance. These results were called by telephone at the time of interpretation on 06/12/2018 at 9:23 pm to Dr. Deno Etienne , who verbally acknowledged these results. Electronically Signed   By: Kristine Garbe M.D.   On: 06/12/2018 21:24   Mr Jeri Cos SJ Contrast  Result Date: 06/14/2018 CLINICAL DATA:  Left anterior temporal lobe lesion. EXAM: MRI HEAD WITHOUT AND WITH CONTRAST TECHNIQUE: Multiplanar, multiecho pulse sequences of the brain and surrounding structures were  obtained without and with intravenous contrast. CONTRAST:  13m MULTIHANCE GADOBENATE DIMEGLUMINE 529 MG/ML IV SOLN COMPARISON:  Head CT with and without contrast 06/12/2018 Brain MRI 05/21/2018 FINDINGS: BRAIN: There is a large amount of edema throughout the left temporal lobe and extending into the left parietal white matter. There is no abnormal diffusion restriction. 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 Multifocal white matter hyperintensity, most commonly due to chronic ischemic microangiopathy. Rightward midline shift measures 3 mm. Minimal left uncus transtentorial herniation. Susceptibility-sensitive sequences show no chronic microhemorrhage or superficial siderosis. Peripherally contrast-enhancing lesion of the anterior left temporal lobe measures 13 x 17 x 14 mm. VASCULAR: Major intracranial arterial and venous sinus flow voids are preserved. SKULL AND UPPER CERVICAL SPINE: The visualized skull base, calvarium, upper cervical spine and extracranial soft tissues are normal. SINUSES/ORBITS: No fluid levels or advanced mucosal thickening. No mastoid or middle ear effusion. The orbits are normal. IMPRESSION: 1. Peripherally contrast-enhancing lesion of the anterior left temporal lobe with severe surrounding vasogenic edema. Primary CNS neoplasm is suspected, though a solitary metastasis is also a possibility. 2. No other contrast-enhancing lesions within the brain. No abnormal diffusion restriction to suggest abscess. 3. 3 mm rightward midline shift and minimal left-sided uncal herniation, unchanged. Electronically Signed   By: KUlyses JarredM.D.   On: 06/14/2018 01:37     Scheduled Meds: . feeding supplement (ENSURE ENLIVE)  237 mL Oral BID BM  . insulin aspart  0-9 Units Subcutaneous Q4H  . lithium carbonate  300 mg Oral Daily  . multivitamin with minerals  1 tablet Oral Daily  . nicotine  21 mg Transdermal Daily  . pantoprazole  40 mg Oral Daily  . QUEtiapine  100 mg Oral QHS  . sodium  chloride flush  3 mL Intravenous Q12H   Continuous Infusions: . sodium chloride       CMarzetta Board MD, PhD [facility] Pager [phone] If 7PM-7AM, please contact night-coverage [email or web address] Password [email or web address]7/13/2019, 2:24 PM

## 2018-06-14 NOTE — NC FL2 (Signed)
[place] MEDICAID FL2 LEVEL OF CARE SCREENING TOOL     IDENTIFICATION  Patient Name: Sara Silva Birthdate: 06-26-1941 Sex: female Admission Date (Current Location): 06/12/2018  [facility] [place] and [place] Number:  Herbalist and Address:  [facility] [place], [place] [address] [place], [place] [address]      Provider Number: [id number]  Attending Physician Name and Address:  Caren Griffins, MD  Relative Name and Phone Number:  [name] [phone] daughter    Current Level of Care: Hospital Recommended Level of Care: [place] Prior Approval Number:    Date Approved/Denied:   PASRR Number:    Discharge Plan: SNF    Current Diagnoses: Patient Active Problem List   Diagnosis Date Noted  . Protein-calorie malnutrition, severe 06/13/2018  . Brain metastases ([place]) 06/13/2018  . Brain tumor ([place]) 06/12/2018  . Tachycardia 06/12/2018  . Altered mental status 06/12/2018  . 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 Constipation 03/23/2016  . Melena 03/23/2016  . Reflux esophagitis   . Mucosal abnormality of stomach   . Chronic diarrhea   . Diverticulosis of colon without hemorrhage   . Abdominal pain 01/03/2015  . Diarrhea 01/03/2015  . DM 01/11/2010  . HYPERLIPIDEMIA 01/11/2010  . STROKE 01/11/2010  . ALLERGIC RHINITIS 01/11/2010  . EMPHYSEMA 01/11/2010    Orientation RESPIRATION BLADDER Height & Weight     Self  Normal Continent Weight: 93 lb 14.7 oz (42.6 kg) Height:     BEHAVIORAL SYMPTOMS/MOOD NEUROLOGICAL BOWEL NUTRITION STATUS    (hx of dementia) Continent Diet(see discharge summary)  AMBULATORY STATUS COMMUNICATION OF NEEDS Skin   Limited Assist Verbally Normal                       Personal Care Assistance Level of Assistance  Bathing, Feeding, Dressing Bathing Assistance: Limited assistance Feeding assistance: Limited  assistance Dressing Assistance: Limited assistance     Functional Limitations Info  Speech, Sight, Hearing Sight Info: Adequate Hearing Info: Adequate Speech Info: Adequate    SPECIAL CARE FACTORS FREQUENCY  PT (By licensed PT), OT (By licensed OT)     PT Frequency: 5x week OT Frequency: 5x week            Contractures Contractures Info: Not present    Additional Factors Info  Code Status, Allergies, Insulin Sliding Scale, Psychotropic Code Status Info: DNR Allergies Info: SULFONAMIDE DERIVATIVES, DILAUDID HYDROMORPHONE HCL  Psychotropic Info: QUEtiapine (SEROQUEL) tablet 100 mg daily at bedtime Insulin Sliding Scale Info: insulin aspart (novoLOG) injection 0-9 Units every 4 hours       Current Medications (06/14/2018):  This is the current hospital active medication list Current Facility-Administered Medications  Medication Dose Route Frequency Provider Last Rate Last Dose  . 0.9 %  sodium chloride infusion  250 mL Intravenous PRN Jani Gravel, MD      . acetaminophen (TYLENOL) tablet 650 mg  650 mg Oral Q6H PRN Jani Gravel, MD       Or  . acetaminophen (TYLENOL) suppository 650 mg  650 mg Rectal Q6H PRN Jani Gravel, MD      . feeding supplement (ENSURE ENLIVE) (ENSURE ENLIVE) liquid 237 mL  237 mL Oral BID BM Caren Griffins, MD   237 mL at 06/14/18 1004  . insulin aspart (novoLOG) injection 0-9 Units  0-9 Units Subcutaneous Q4H Jani Gravel, MD   1 Units at 06/14/18 [phone]  .  lithium carbonate capsule 300 mg  300 mg Oral Daily Jani Gravel, MD   300 mg at 06/14/18 1005  . LORazepam (ATIVAN) injection 1 mg  1 mg Intravenous Q4H PRN Caren Griffins, MD   1 mg at 06/14/18 0015  . multivitamin with minerals tablet 1 tablet  1 tablet Oral Daily Caren Griffins, MD   1 tablet at 06/14/18 1005  . nicotine (NICODERM CQ - dosed in mg/24 hours) patch 21 mg  21 mg Transdermal Daily Caren Griffins, MD   21 mg at 06/14/18 1005  . pantoprazole (PROTONIX) EC tablet 40 mg  40 mg Oral  Daily Jani Gravel, MD   40 mg at 06/14/18 1004  . QUEtiapine (SEROQUEL) tablet 100 mg  100 mg Oral Loma Sousa, MD   100 mg at 06/14/18 0014  . sodium chloride flush (NS) 0.9 % injection 3 mL  3 mL Intravenous Q12H Jani Gravel, MD   3 mL at 06/14/18 1005  . sodium chloride flush (NS) 0.9 % injection 3 mL  3 mL Intravenous PRN Jani Gravel, MD         Discharge Medications: Please see discharge summary for a list of discharge medications.  Relevant Imaging Results:  Relevant Lab Results:   Additional Information SS#[id number] [employer] [place], [place]

## 2018-06-14 NOTE — Progress Notes (Signed)
Patient ID: Sara Silva, female   DOB: 10/02/1941, 77 y.o.   MRN: [id number] Family is clear that no surgery will be undertaken. Call if there are any questions. Would continue anticonvulsants, and decadron ~4MG /d until death. Informed family I think she will succumb to the tumor within 3 months.

## 2018-06-14 NOTE — Progress Notes (Signed)
Patient ID: Sara Silva, female   DOB: [birth year], 77 y.o.   MRN: [id number] Films reviewed. I will speak with the family to decide what course of action they would like to pursue. The CT chest, abdomen, and pelvis remains pending. Will need that information also before speaking with family.

## 2018-06-15 LAB — GLUCOSE, CAPILLARY
GLUCOSE-CAPILLARY: 78 mg/dL (ref 70–99)
GLUCOSE-CAPILLARY: 109 mg/dL — AB (ref 70–99)
Glucose-Capillary: 90 mg/dL (ref 70–99)

## 2018-06-15 MED ORDER — LORAZEPAM 1 MG PO TABS
1.0000 mg | ORAL_TABLET | Freq: Four times a day (QID) | ORAL | Status: DC | PRN
  Administered 2018-06-15: 1 mg via ORAL
  Filled 2018-06-15: qty 1

## 2018-06-15 MED ORDER — LORAZEPAM 1 MG PO TABS
1.0000 mg | ORAL_TABLET | Freq: Four times a day (QID) | ORAL | Status: DC | PRN
  Administered 2018-06-16: 1 mg via ORAL
  Filled 2018-06-15 (×2): qty 1

## 2018-06-15 MED ORDER — CLONAZEPAM 0.5 MG PO TABS
0.5000 mg | ORAL_TABLET | Freq: Two times a day (BID) | ORAL | Status: DC
  Administered 2018-06-15 – 2018-06-16 (×3): 0.5 mg via ORAL
  Filled 2018-06-15 (×4): qty 1

## 2018-06-15 MED ORDER — GUAIFENESIN-DM 100-10 MG/5ML PO SYRP
5.0000 mL | ORAL_SOLUTION | ORAL | Status: DC | PRN
  Administered 2018-06-15: 5 mL via ORAL
  Filled 2018-06-15: qty 5

## 2018-06-15 MED ORDER — QUETIAPINE FUMARATE 25 MG PO TABS
25.0000 mg | ORAL_TABLET | Freq: Three times a day (TID) | ORAL | Status: DC | PRN
  Filled 2018-06-15: qty 1

## 2018-06-15 MED ORDER — HALOPERIDOL 2 MG PO TABS
2.0000 mg | ORAL_TABLET | Freq: Once | ORAL | Status: AC
  Administered 2018-06-15: 2 mg via ORAL
  Filled 2018-06-15: qty 1

## 2018-06-15 NOTE — Progress Notes (Signed)
PROGRESS NOTE  Sara Silva XNA:[id number] DOB: 12-24-1940 DOA: 06/12/2018 PCP: Marjo Bicker, MD   LOS: 3 days   Brief Narrative / Interim history: 77 year old female with hypertension, hyperlipidemia, diabetes, COPD as well as dementia who came in with worsening confusion and agitation per her daughter.  In the ED CT of the brain showed a ring-enhancing lesion about 16 mm with vasogenic edema as well as mass-effect with 4 mm left to right midline shift and mild left uncal herniation.  Neurosurgery was consulted and she was admitted to the hospital.  Assessment & Plan: Principal Problem:   Brain tumor [facility]) Active Problems:   Tachycardia   Altered mental status   Protein-calorie malnutrition, severe   Brain metastases (HCC)   Goals of care, counseling/discussion   Palliative care by specialist   Agitation   Brain mass -Neurosurgery consulted, discussed with Dr. Christella Noa today, patient underwent an MRI of the brain which showed peripherally contrast-enhancing lesion of the anterior left temporal lobe with severe surrounding vasogenic edema, suspicious for primary CNS neoplasm versus solitary metastasis.  It does not appear to be an abscess -Palliative care consulted and per POA's and family's wishes will not pursue further work-up, to be discharged to SNF with palliative follow-up, and hospice if she decompensates.  She will need to be on Decadron daily per neurosurgery  Diabetes mellitus -Placed on sliding scale, CBGs fairly well controlled, keep on same regimen  Dementia with behavioral disturbances -continue Seroquel, continue lithium, Ativan -Appreciate palliative assistance  Goals of care -She is DNR, pending work-up as above possible that she has primary CNS malignancy versus metastatic cancer, less likely abscess -No further work-up per family wishes, discharge planning pending   DVT prophylaxis: SCDs Code Status: DNR Family Communication: no family at bedside  today Disposition Plan: TBD  Consultants:   Neurosurgery   Palliative   Procedures:   None   Antimicrobials:  None    Subjective: - no chest pain, shortness of breath, no abdominal pain, nausea or vomiting.   Objective: Vitals:   06/15/18 0400 06/15/18 0414 06/15/18 0500 06/15/18 0803  BP: (!) 96/50   125/68  Pulse: (!) 102   96  Resp: 18   18  Temp: 98.2 F (36.8 C)   97.8 F (36.6 C)  TempSrc:    Axillary  SpO2: 92% 96%  96%  Weight:   43.7 kg (96 lb 5.5 oz)    No intake or output data in the 24 hours ending 06/15/18 1231 Filed Weights   06/13/18 0426 06/15/18 0500  Weight: 42.6 kg (93 lb 14.7 oz) 43.7 kg (96 lb 5.5 oz)    Examination:  Constitutional: NAD Respiratory: CTA Cardiovascular: RRR  Data Reviewed: I have independently reviewed following labs and imaging studies   CBC: Recent Labs  Lab 06/12/18 1924 06/12/18 1939  WBC 8.1  --   NEUTROABS 4.7  --   HGB 11.9* 12.2  HCT 37.9 36.0  MCV 101.9*  --   PLT 354  --    Basic Metabolic Panel: Recent Labs  Lab 06/12/18 1924 06/12/18 1939  NA 140 139  K 3.6 4.3  CL 106 104  CO2 24  --   GLUCOSE 91 88  BUN 16 22  CREATININE 0.61 0.50  CALCIUM 9.1  --    GFR: Estimated Creatinine Clearance: 40 mL/min (by C-G formula based on SCr of 0.5 mg/dL). Liver Function Tests: Recent Labs  Lab 06/12/18 1924  AST 16  ALT 11  ALKPHOS 66  BILITOT 0.5  PROT 5.7*  ALBUMIN 3.4*   No results for input(s): LIPASE, AMYLASE in the last 168 hours. Recent Labs  Lab 06/12/18 1924  AMMONIA 28   Coagulation Profile: No results for input(s): INR, PROTIME in the last 168 hours. Cardiac Enzymes: No results for input(s): CKTOTAL, CKMB, CKMBINDEX, TROPONINI in the last 168 hours. BNP (last 3 results) No results for input(s): PROBNP in the last 8760 hours. HbA1C: No results for input(s): HGBA1C in the last 72 hours. CBG: Recent Labs  Lab 06/14/18 1938 06/14/18 2224 06/15/18 0005 06/15/18 0306  06/15/18 1152  GLUCAP 76 94 90 78 109*   Lipid Profile: No results for input(s): CHOL, HDL, LDLCALC, TRIG, CHOLHDL, LDLDIRECT in the last 72 hours. Thyroid Function Tests: No results for input(s): TSH, T4TOTAL, FREET4, T3FREE, THYROIDAB in the last 72 hours. Anemia Panel: No results for input(s): VITAMINB12, FOLATE, FERRITIN, TIBC, IRON, RETICCTPCT in the last 72 hours. Urine analysis:    Component Value Date/Time   COLORURINE YELLOW 06/12/2018 1812   APPEARANCEUR CLEAR 06/12/2018 1812   LABSPEC 1.024 06/12/2018 1812   PHURINE 6.0 06/12/2018 1812   GLUCOSEU 150 (A) 06/12/2018 1812   HGBUR NEGATIVE 06/12/2018 1812   BILIRUBINUR NEGATIVE 06/12/2018 1812   KETONESUR NEGATIVE 06/12/2018 1812   PROTEINUR NEGATIVE 06/12/2018 1812   NITRITE NEGATIVE 06/12/2018 1812   LEUKOCYTESUR NEGATIVE 06/12/2018 1812   Sepsis Labs: Invalid input(s): PROCALCITONIN, LACTICIDVEN  Recent Results (from the past 240 hour(s))  Urine culture     Status: None   Collection Time: 06/13/18 12:22 PM  Result Value Ref Range Status   Specimen Description URINE, CLEAN CATCH  Final   Special Requests NONE  Final   Culture   Final    NO GROWTH Performed at [facility], [place] [address], [place], [place] [address]    Report Status 06/14/2018 FINAL  Final      Radiology Studies: Mr Jeri Cos JT Contrast  Result Date: 06/14/2018 CLINICAL DATA:  Left anterior temporal lobe lesion. EXAM: MRI HEAD WITHOUT AND WITH CONTRAST TECHNIQUE: Multiplanar, multiecho pulse sequences of the brain and surrounding structures were obtained without and with intravenous contrast. CONTRAST:  47mL MULTIHANCE GADOBENATE DIMEGLUMINE 529 MG/ML IV SOLN COMPARISON:  Head CT with and without contrast 06/12/2018 Brain MRI 05/21/2018 FINDINGS: BRAIN: There is a large amount of edema throughout the left temporal lobe and extending into the left parietal white matter. There is no abnormal diffusion restriction. 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 Multifocal white matter hyperintensity, most commonly due to chronic ischemic microangiopathy. Rightward midline shift measures 3 mm. Minimal left uncus transtentorial herniation. Susceptibility-sensitive sequences show no chronic microhemorrhage or superficial siderosis. Peripherally contrast-enhancing lesion of the anterior left temporal lobe measures 13 x 17 x 14 mm. VASCULAR: Major intracranial arterial and venous sinus flow voids are preserved. SKULL AND UPPER CERVICAL SPINE: The visualized skull base, calvarium, upper cervical spine and extracranial soft tissues are normal. SINUSES/ORBITS: No fluid levels or advanced mucosal thickening. No mastoid or middle ear effusion. The orbits are normal. IMPRESSION: 1. Peripherally contrast-enhancing lesion of the anterior left temporal lobe with severe surrounding vasogenic edema. Primary CNS neoplasm is suspected, though a solitary metastasis is also a possibility. 2. No other contrast-enhancing lesions within the brain. No abnormal diffusion restriction to suggest abscess. 3. 3 mm rightward midline shift and minimal left-sided uncal herniation, unchanged. Electronically Signed   By: Ulyses Jarred M.D.   On: 06/14/2018 01:37  Scheduled Meds: . clonazePAM  0.5 mg Oral QHS  . divalproex  250 mg Oral TID  . feeding supplement (ENSURE ENLIVE)  237 mL Oral BID BM  . insulin aspart  0-9 Units Subcutaneous Q4H  . lithium carbonate  300 mg Oral Daily  . nicotine  21 mg Transdermal Daily  . pantoprazole  40 mg Oral Daily  . QUEtiapine  100 mg Oral QHS  . sodium chloride flush  3 mL Intravenous Q12H   Continuous Infusions: . sodium chloride       Marzetta Board, MD, PhD [facility] Pager [phone]  If 7PM-7AM, please contact night-coverage [email or web address] Password [facility] 06/15/2018, 12:31 PM

## 2018-06-15 NOTE — Progress Notes (Signed)
Daily Progress Note   Patient Name: Sara Silva       Date: 06/15/2018 DOB: 08-22-1941  Age: 77 y.o. MRN#: [id number] Attending Physician: Caren Griffins, MD Primary Care Physician: Marjo Bicker, MD Admit Date: 06/12/2018  Reason for Consultation/Follow-up: Establishing goals of care, Non pain symptom management and Psychosocial/spiritual support  Subjective: Patient seen, chart reviewed.  Staffed with bedside RN.  Patient had a better night.  Decreased psychomotor agitation and has been resting better.  She was ambulate to the bathroom with assist.  Still eating only bites and sips mostly drinking.  RN reports that sitter likely will be DC'd today  Length of Stay: 3  Current Medications: Scheduled Meds:  . clonazePAM  0.5 mg Oral QHS  . divalproex  250 mg Oral TID  . feeding supplement (ENSURE ENLIVE)  237 mL Oral BID BM  . insulin aspart  0-9 Units Subcutaneous Q4H  . lithium carbonate  300 mg Oral Daily  . nicotine  21 mg Transdermal Daily  . pantoprazole  40 mg Oral Daily  . QUEtiapine  100 mg Oral QHS  . sodium chloride flush  3 mL Intravenous Q12H    Continuous Infusions: . sodium chloride      PRN Meds: sodium chloride, acetaminophen **OR** acetaminophen, LORazepam, sodium chloride flush  Physical Exam  Constitutional:  Cachectic, frail elderly female; currently resting  HENT:  Head: Normocephalic and atraumatic.  Cardiovascular: Normal rate.  Pulmonary/Chest: Effort normal.  Neurological:  Somnolent  Skin: Skin is warm and dry. There is pallor.  Psychiatric:  Decreased psychomotor restlessness  Nursing note and vitals reviewed.           Vital Signs: BP 125/68 (BP Location: Left Arm)   Pulse 96   Temp 97.8 F (36.6 C) (Axillary)   Resp 18    Wt 43.7 kg (96 lb 5.5 oz)   SpO2 96%   BMI 17.62 kg/m  SpO2: SpO2: 96 % O2 Device: O2 Device: Nasal Cannula O2 Flow Rate: O2 Flow Rate (L/min): 2 L/min  Intake/output summary: No intake or output data in the 24 hours ending 06/15/18 1139 LBM: Last BM Date: 06/12/18 Baseline Weight: Weight: 42.6 kg (93 lb 14.7 oz) Most recent weight: Weight: 43.7 kg (96 lb 5.5 oz)       Palliative Assessment/Data:  Flowsheet Rows     Most Recent Value  Intake Tab  Referral Department  Hospitalist  Unit at Time of Referral  Med/Surg Unit  Palliative Care Primary Diagnosis  Neurology  Date Notified  06/12/18  Palliative Care Type  New Palliative care  Reason for referral  Clarify Goals of Care  Date of Admission  06/12/18  Date first seen by Palliative Care  06/14/18  # of days Palliative referral response time  2 Day(s)  # of days IP prior to Palliative referral  0  Clinical Assessment  Palliative Performance Scale Score  40%  Pain Max last 24 hours  Not able to report  Pain Min Last 24 hours  Not able to report  Dyspnea Max Last 24 Hours  Not able to report  Dyspnea Min Last 24 hours  Not able to report  Nausea Max Last 24 Hours  Not able to report  Nausea Min Last 24 Hours  Not able to report  Anxiety Max Last 24 Hours  Not able to report  Anxiety Min Last 24 Hours  Not able to report  Other Max Last 24 Hours  Not able to report  Psychosocial & Spiritual Assessment  Palliative Care Outcomes  Patient/Family meeting held?  Yes  Who was at the meeting?  dtr's Hilda Blades and [name]  Patient/Family wishes: Interventions discontinued/not started   Mechanical Ventilation  Palliative Care follow-up planned  Yes, Facility      Patient Active Problem List   Diagnosis Date Noted  . Goals of care, counseling/discussion   . Palliative care by specialist   . Restlessness and agitation   . Protein-calorie malnutrition, severe 06/13/2018  . Brain metastases ([place]) 06/13/2018  . Brain tumor ([place])  06/12/2018  . Tachycardia 06/12/2018  . Altered mental status 06/12/2018  . 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 Constipation 03/23/2016  . Melena 03/23/2016  . Reflux esophagitis   . Mucosal abnormality of stomach   . Chronic diarrhea   . Diverticulosis of colon without hemorrhage   . Abdominal pain 01/03/2015  . Diarrhea 01/03/2015  . DM 01/11/2010  . HYPERLIPIDEMIA 01/11/2010  . STROKE 01/11/2010  . ALLERGIC RHINITIS 01/11/2010  . EMPHYSEMA 01/11/2010    Palliative Care Assessment & Plan   Patient Profile: 77 y.o. female  with past medical history of bipolar disorder, hyperlipidemia, COPD, chronic back pain, hypertension, dementia admitted on 06/12/2018 with confusion, altered mental status, agitation.  Patient was recently diagnosed with a brain tumor in June 2019.  Per MRI scan patient has left temporal lobe, ring-enhancing lesion; primary CNS versus metastatic disease and/or abscess.  Neurosurgery has been consulted .    Consult ordered for goals of care.  After family meeting on 06/14/2018, family has elected comfort based approach with transfer to skilled nursing facility in [place], [place].  Social work consult in place for SNF with hospice  Recommendations/Plan:  Agitation: Improving.  Continue with Depakote 250 mg 3 times daily as well as Klonopin 0.5 nightly and Ativan 1 mg as needed.  Continue Seroquel at bedtime  Brain lesion: Continue with Decadron 4 mg daily  Goals of Care and Additional Recommendations:  Limitations on Scope of Treatment: Avoid Hospitalization, Initiate Comfort Feeding, No Artificial Feeding, No Blood Transfusions, No Chemotherapy, No Hemodialysis, No Radiation, No Surgical Procedures and No Tracheostomy  Code Status:    Code Status Orders  (From admission, onward)        Start  Ordered   06/13/18 0012  Do not attempt resuscitation (DNR)  Continuous      Question Answer Comment  In the event of cardiac or respiratory ARREST Do not call a "code blue"   In the event of cardiac or respiratory ARREST Do not perform Intubation, CPR, defibrillation or ACLS   In the event of cardiac or respiratory ARREST Use medication by any route, position, wound care, and other measures to relive pain and suffering. May use oxygen, suction and manual treatment of airway obstruction as needed for comfort.      06/13/18 0013    Code Status History    This patient has a current code status but no historical code status.        Prognosis:< 3 months in the setting of high grade CNS lesion    Discharge Planning:  [place] with Hospice  Care plan was discussed with bedside RN  Thank you for allowing the Palliative Medicine Team to assist in the care of this patient.   Time In: 0930 Time Out: 1000 Total Time 30 min Prolonged Time Billed  no       Greater than 50%  of this time was spent counseling and coordinating care related to the above assessment and plan.  Dory Horn, NP  Please contact Palliative Medicine Team phone at [phone] for questions and concerns.

## 2018-06-15 NOTE — Plan of Care (Signed)
Patient stable, discussed POC with patient and family, agreeable with plan, denies question/concerns at this time. Copy of POA placed in chart.

## 2018-06-16 LAB — GLUCOSE, CAPILLARY
GLUCOSE-CAPILLARY: 90 mg/dL (ref 70–99)
GLUCOSE-CAPILLARY: 71 mg/dL (ref 70–99)
GLUCOSE-CAPILLARY: 99 mg/dL (ref 70–99)
GLUCOSE-CAPILLARY: 79 mg/dL (ref 70–99)
GLUCOSE-CAPILLARY: 118 mg/dL — AB (ref 70–99)
Glucose-Capillary: 107 mg/dL — ABNORMAL HIGH (ref 70–99)
Glucose-Capillary: 53 mg/dL — ABNORMAL LOW (ref 70–99)
Glucose-Capillary: 75 mg/dL (ref 70–99)
Glucose-Capillary: 74 mg/dL (ref 70–99)
Glucose-Capillary: 166 mg/dL — ABNORMAL HIGH (ref 70–99)
Glucose-Capillary: 101 mg/dL — ABNORMAL HIGH (ref 70–99)

## 2018-06-16 MED ORDER — DEXAMETHASONE 4 MG PO TABS
4.0000 mg | ORAL_TABLET | Freq: Every day | ORAL | Status: DC
  Administered 2018-06-16: 4 mg via ORAL
  Filled 2018-06-16 (×2): qty 1

## 2018-06-16 NOTE — Clinical Social Work Note (Signed)
Clinical Social Work Assessment  Patient Details  Name: Sara Silva MRN: [id number] Date of Birth: December 19, 1940  Date of referral:  06/16/18               Reason for consult:  Facility Placement, Discharge Planning                Permission sought to share information with:  Family Supports Permission granted to share information::  No(Patient oriented to self only)  Name::     Sara Silva  Agency::     Relationship::  Daughter and POA  Contact Information:  [phone]  Housing/Transportation Living arrangements for the past 2 months:  Single Family Home(Patient lives alone) Source of Information:  Adult Children(Daughter Sara Silva) Patient Interpreter Needed:  None Criminal Activity/Legal Involvement Pertinent to Current Situation/Hospitalization:  No - Comment as needed Significant Relationships:  Adult Children Lives with:  Self Do you feel safe going back to the place where you live?  No(Daughters in agreement with ST rehab due to patient's health issues ) Need for family participation in patient care:  Yes (Comment)  Care giving concerns: Daughter Sara Silva reported that prior to admission to hospital, she went to her mother's home daily to check on her and take her with her to run errands or just to ride.  Social Worker assessment / plan:CSW talked with daughter by phone to discuss discharge disposition and contact that was made with Sara Silva admissions staff regarding her mother. Ms. Sara Silva was advised that [facility] will accept her mother on Tuesday, 7/16 for ST rehab. Daughter explained that she visited with her mother daily and knew that she was getting sick, however her mother would always refuse to go to a doctor. Ms. Sara Silva advised CSW that her mother has a tumor on her brain.   CSW and daughter also talked about SNF placement and Ms. Sara Silva advised that patient's current insurance will pay for ST rehab only and this was discussed. Daughter encouraged to apply for  Medicaid once patient goes to SNF, and daughter intends to do this. Daughter also encouraged daughter to talk with facility regarding her intent to apply for Medicaid for her mother. Ms. Sara Silva informed CSW that her mother has not been making good decisions in general and also with her spending and this was discussed.   Employment status:  Retired Health visitor, Managed Care PT Recommendations:  Sara Silva / Referral to community resources:  Other (Comment Required)(CSW provided with daughter's ufacility preference)  Patient/Family's Response to care:  No concerns expressed regarding patient's care during hospitalization.  Patient/Family's Understanding of and Emotional Response to Diagnosis, Current Treatment, and Prognosis:  Daughters aware of patient's current health concerns and prognosis as explained to them by MD. Daughter hopeful that patient can stay at facility due to health issues and prognosis and this was discussed.  Emotional Assessment Appearance:  Other (Comment Required(Talked with daughter by phone) Attitude/Demeanor/Rapport:  Unable to Assess(Did not visit room, talked with daughter by phone) Affect (typically observed):  Unable to Assess Orientation:  Oriented to Self Alcohol / Substance use:  Tobacco Use, Alcohol Use, Illicit Drugs(Per H&P patient smokes and does not drink or use illicit drugs) Psych involvement (Current and /or in the community):  No (Comment)  Discharge Needs  Concerns to be addressed:  Discharge Planning Concerns Readmission within the last 30 days:  No Current discharge risk:  None Barriers to Discharge:  Continued Medical Work up   [employer], Sara Homer,  LCSW 06/16/2018, 6:14 PM

## 2018-06-16 NOTE — Progress Notes (Signed)
Palliative Medicine RN Note: Daily symptom check. 2 daughters are in the room.   After intense discussions with her daughters and several phone calls to [facility] in [place] (intake [address] Nigel Berthold [phone]), it has been determined that there will be a bed for Mrs Sara Silva 7/16. They will call her POA, daughter Sara Silva at [phone] to give her an update and get settle financial concerns. Patient's daughter Sara Silva (who is not the POA) is anxious to get [name] out ASAP, but [name] understands the delay.  RN Aaron Edelman, charge RN Olivia Mackie, SW Lorriane Shire, and Dr Cruzita Lederer have all been updated.   I will follow up with [name] tomorrow to ensure continued comfort.  Marjie Skiff Demetrius Barrell, RN, BSN, [facility] Palliative Medicine Team 06/16/2018 4:30 PM Office [phone]

## 2018-06-16 NOTE — Progress Notes (Signed)
PROGRESS NOTE  Sara Silva UKG:[id number] DOB: [birth year] DOA: 06/12/2018 PCP: Marjo Bicker, MD   LOS: 3 days   Brief Narrative / Interim history: 77 year old female with hypertension, hyperlipidemia, diabetes, COPD as well as dementia who came in with worsening confusion and agitation per her daughter.  In the ED CT of the brain showed a ring-enhancing lesion about 16 mm with vasogenic edema as well as mass-effect with 4 mm left to right midline shift and mild left uncal herniation.  Neurosurgery was consulted and she was admitted to the hospital.  Assessment & Plan: Principal Problem:   Brain tumor [facility]) Active Problems:   Tachycardia   Altered mental status   Protein-calorie malnutrition, severe   Brain metastases (HCC)   Goals of care, counseling/discussion   Palliative care by specialist   Agitation   Brain mass -Neurosurgery consulted, discussed with Dr. Christella Noa today, patient underwent an MRI of the brain which showed peripherally contrast-enhancing lesion of the anterior left temporal lobe with severe surrounding vasogenic edema, suspicious for primary CNS neoplasm versus solitary metastasis.  It does not appear to be an abscess -Palliative care consulted and per POA's and family's wishes will not pursue further work-up, to be discharged to SNF with palliative follow-up, and hospice if she decompensates.  She will need to be on Decadron 4 mg daily per neurosurgery -awaiting placement, per social worker cannot go today because she has a sitter up until late last night and needs to be sitter free for at least 24 hours  Diabetes mellitus -Placed on sliding scale, CBGs fairly well controlled, keep on same regimen  Dementia with behavioral disturbances -Appreciate palliative assistance -Currently on Klonopin twice daily, Depakote 3 times daily, lithium, Seroquel, ativan prn  Goals of care -She is DNR, pending work-up as above possible that she has primary CNS  malignancy versus metastatic cancer, less likely abscess -No further work-up per family wishes, discharge planning pending   DVT prophylaxis: SCDs Code Status: DNR Family Communication: no family at bedside today Disposition Plan: TBD, SNF once 24h without sitter  Consultants:   Neurosurgery   Palliative   Procedures:   None   Antimicrobials:  None    Subjective: - confused  Objective: Vitals:   06/16/18 0320 06/16/18 0852 06/16/18 1130 06/16/18 1150  BP: 113/66 115/61 137/73 123/76  Pulse: 87 95 (!) 104 (!) 104  Resp: 18 18 20 20   Temp: 97.7 F (36.5 C)   98.4 F (36.9 C)  TempSrc: Axillary   Oral  SpO2: 97% 95% 94% 93%  Weight: 43 kg (94 lb 13.8 oz)       Intake/Output Summary (Last 24 hours) at 06/16/2018 1245 Last data filed at 06/15/2018 1700 Gross per 24 hour  Intake 340 ml  Output -  Net 340 ml   Filed Weights   06/15/18 0500 06/15/18 1840 06/16/18 0320  Weight: 43.7 kg (96 lb 5.5 oz) 43.5 kg (95 lb 14.4 oz) 43 kg (94 lb 13.8 oz)    Examination:  Constitutional: no distress  Respiratory: CTA Cardiovascular: RRR  Data Reviewed: I have independently reviewed following labs and imaging studies   CBC: Recent Labs  Lab 06/12/18 1924 06/12/18 1939  WBC 8.1  --   NEUTROABS 4.7  --   HGB 11.9* 12.2  HCT 37.9 36.0  MCV 101.9*  --   PLT 354  --    Basic Metabolic Panel: Recent Labs  Lab 06/12/18 1924 06/12/18 1939  NA 140 139  K 3.6  4.3  CL 106 104  CO2 24  --   GLUCOSE 91 88  BUN 16 22  CREATININE 0.61 0.50  CALCIUM 9.1  --    GFR: Estimated Creatinine Clearance: 39.3 mL/min (by C-G formula based on SCr of 0.5 mg/dL). Liver Function Tests: Recent Labs  Lab 06/12/18 1924  AST 16  ALT 11  ALKPHOS 66  BILITOT 0.5  PROT 5.7*  ALBUMIN 3.4*   No results for input(s): LIPASE, AMYLASE in the last 168 hours. Recent Labs  Lab 06/12/18 1924  AMMONIA 28   Coagulation Profile: No results for input(s): INR, PROTIME in the last  168 hours. Cardiac Enzymes: No results for input(s): CKTOTAL, CKMB, CKMBINDEX, TROPONINI in the last 168 hours. BNP (last 3 results) No results for input(s): PROBNP in the last 8760 hours. HbA1C: No results for input(s): HGBA1C in the last 72 hours. CBG: Recent Labs  Lab 06/15/18 1950 06/15/18 2307 06/16/18 0317 06/16/18 0853 06/16/18 1147  GLUCAP 75 99 79 118* 74   Lipid Profile: No results for input(s): CHOL, HDL, LDLCALC, TRIG, CHOLHDL, LDLDIRECT in the last 72 hours. Thyroid Function Tests: No results for input(s): TSH, T4TOTAL, FREET4, T3FREE, THYROIDAB in the last 72 hours. Anemia Panel: No results for input(s): VITAMINB12, FOLATE, FERRITIN, TIBC, IRON, RETICCTPCT in the last 72 hours. Urine analysis:    Component Value Date/Time   COLORURINE YELLOW 06/12/2018 1812   APPEARANCEUR CLEAR 06/12/2018 1812   LABSPEC 1.024 06/12/2018 1812   PHURINE 6.0 06/12/2018 1812   GLUCOSEU 150 (A) 06/12/2018 1812   HGBUR NEGATIVE 06/12/2018 1812   BILIRUBINUR NEGATIVE 06/12/2018 1812   KETONESUR NEGATIVE 06/12/2018 1812   PROTEINUR NEGATIVE 06/12/2018 1812   NITRITE NEGATIVE 06/12/2018 1812   LEUKOCYTESUR NEGATIVE 06/12/2018 1812   Sepsis Labs: Invalid input(s): PROCALCITONIN, LACTICIDVEN  Recent Results (from the past 240 hour(s))  Urine culture     Status: None   Collection Time: 06/13/18 12:22 PM  Result Value Ref Range Status   Specimen Description URINE, CLEAN CATCH  Final   Special Requests NONE  Final   Culture   Final    NO GROWTH Performed at [facility], [place] [address], [place], [place] [address]    Report Status 06/14/2018 FINAL  Final      Radiology Studies: No results found.   Scheduled Meds: . clonazePAM  0.5 mg Oral BID  . divalproex  250 mg Oral TID  . feeding supplement (ENSURE ENLIVE)  237 mL Oral BID BM  . insulin aspart  0-9 Units Subcutaneous Q4H  . lithium carbonate  300 mg Oral Daily  . nicotine  21 mg Transdermal Daily  .  pantoprazole  40 mg Oral Daily  . QUEtiapine  100 mg Oral QHS  . sodium chloride flush  3 mL Intravenous Q12H   Continuous Infusions: . sodium chloride       Marzetta Board, MD, PhD [facility] Pager [phone]  If 7PM-7AM, please contact night-coverage [email or web address] Password [facility] 06/16/2018, 12:45 PM

## 2018-06-16 NOTE — Progress Notes (Signed)
Physical Therapy Treatment Patient Details Name: Sara Silva MRN: [id number] DOB: [birth year] Today's Date: 06/16/2018    History of Present Illness Patient is a 77 y.o. female presenting with worsening confusion. Per chart, ptient diagnosed with brain tumor in mid June. CT revealing Left anterior temporal lobe ring-enhancing lesion measuring 16 mm with increased vasogenic edema. Associated mass effect with 4 mm left-to-right midline shift and mild left uncal herniation. Patient with a PMH significant for hypertension, hyperlipidemia, Dm2, Copd not on home o2, Dementia.    PT Comments    [name] doing well this afternoon and willing to participate with PT. Session focusing on improving safe functional mobility. Patient with slightly increased instability with gait today as compared to prior session with nursing notifying PT of fall earlier today without injury. Requires Min A for all transfers and mobility for safety along with max verbal cueing for safety with dynamic activities. PT to continue to follow acutely.    Follow Up Recommendations  SNF;Supervision/Assistance - 24 hour     Equipment Recommendations  (TBD)    Recommendations for Other Services       Precautions / Restrictions Precautions Precautions: Fall Restrictions Weight Bearing Restrictions: No    Mobility  Bed Mobility Overal bed mobility: Needs Assistance Bed Mobility: Supine to Sit;Sit to Supine     Supine to sit: Min assist Sit to supine: Min assist   General bed mobility comments: Min A for LE management; tactile cueing for movement initiation  Transfers Overall transfer level: Needs assistance Equipment used: 1 person hand held assist Transfers: Sit to/from Stand Sit to Stand: Min assist;Min guard         General transfer comment: Min A to min guard for safety and immediate standing balance.   Ambulation/Gait Ambulation/Gait assistance: Min guard Gait Distance (Feet): 110  Feet Assistive device: 1 person hand held assist Gait Pattern/deviations: Step-through pattern;Decreased stride length;Trunk flexed Gait velocity: decreased   General Gait Details: increased instability with gait today as compared to prior session; heavy verbal cueing throughout for safety with limited carryover; downward gaze   Stairs             Wheelchair Mobility    Modified Rankin (Stroke Patients Only)       Balance Overall balance assessment: Needs assistance Sitting-balance support: No upper extremity supported;Feet supported Sitting balance-Leahy Scale: Fair     Standing balance support: Single extremity supported;During functional activity Standing balance-Leahy Scale: Poor                              Cognition Arousal/Alertness: Awake/alert Behavior During Therapy: WFL for tasks assessed/performed Overall Cognitive Status: Impaired/Different from baseline Area of Impairment: Orientation;Following commands;Safety/judgement;Problem solving;Attention                 Orientation Level: Disoriented to;Place;Situation Current Attention Level: Sustained   Following Commands: Follows one step commands inconsistently;Follows one step commands with increased time Safety/Judgement: Decreased awareness of safety;Decreased awareness of deficits   Problem Solving: Slow processing;Decreased initiation;Difficulty sequencing;Requires verbal cues;Requires tactile cues General Comments: pleasantly confused      Exercises      General Comments        Pertinent Vitals/Pain Pain Assessment: No/denies pain    Home Living                      Prior Function  PT Goals (current goals can now be found in the care plan section) Acute Rehab PT Goals Patient Stated Goal: none stated PT Goal Formulation: With patient Time For Goal Achievement: 06/27/18 Potential to Achieve Goals: Fair Progress towards PT goals: Progressing  toward goals    Frequency    Min 3X/week      PT Plan Current plan remains appropriate    Co-evaluation              AM-PAC PT "6 Clicks" Daily Activity  Outcome Measure  Difficulty turning over in bed (including adjusting bedclothes, sheets and blankets)?: A Little Difficulty moving from lying on back to sitting on the side of the bed? : Unable Difficulty sitting down on and standing up from a chair with arms (e.g., wheelchair, bedside commode, etc,.)?: Unable Help needed moving to and from a bed to chair (including a wheelchair)?: A Little Help needed walking in hospital room?: A Little Help needed climbing 3-5 steps with a railing? : A Lot 6 Click Score: 13    End of Session Equipment Utilized During Treatment: Gait belt Activity Tolerance: Patient tolerated treatment well Patient left: in bed;with call bell/phone within reach;with bed alarm set;with family/visitor present Nurse Communication: Mobility status PT Visit Diagnosis: Unsteadiness on feet (R26.81);Other abnormalities of gait and mobility (R26.89);Muscle weakness (generalized) (M62.81)     Time: 2536-6440 PT Time Calculation (min) (ACUTE ONLY): 15 min  Charges:  $Gait Training: 8-22 mins                    G Codes:       Lanney Gins, PT, DPT 06/16/18 1:33 PM Pager: [phone]

## 2018-06-16 NOTE — Progress Notes (Signed)
Patient fell onto the floor after getting out of bed. Bed exit was on and bed in low position. No injuries after fall, MD notified with no new orders. Time of incident 1130 am

## 2018-06-16 NOTE — Progress Notes (Signed)
Spoke to son in law, Legrand Como, who was updated about condition. I said it would be helpful to have family member sit with family, and they are unable to be here.  He gave me permission to keep 4 side rails up. Bed alarm is on most sensitive settings. Will monitor.

## 2018-06-16 NOTE — Care Management Important Message (Signed)
Important Message  Patient Details  Name: Sara Silva MRN: [id number] Date of Birth: [birth year]   Medicare Important Message Given:  Yes    Orbie Pyo 06/16/2018, 3:24 PM

## 2018-06-17 DIAGNOSIS — E43 Unspecified severe protein-calorie malnutrition: Secondary | ICD-10-CM

## 2018-06-17 DIAGNOSIS — R451 Restlessness and agitation: Secondary | ICD-10-CM

## 2018-06-17 DIAGNOSIS — Z7189 Other specified counseling: Secondary | ICD-10-CM

## 2018-06-17 DIAGNOSIS — R4182 Altered mental status, unspecified: Secondary | ICD-10-CM

## 2018-06-17 LAB — GLUCOSE, CAPILLARY
GLUCOSE-CAPILLARY: 127 mg/dL — AB (ref 70–99)
Glucose-Capillary: 117 mg/dL — ABNORMAL HIGH (ref 70–99)
Glucose-Capillary: 144 mg/dL — ABNORMAL HIGH (ref 70–99)
Glucose-Capillary: 78 mg/dL (ref 70–99)

## 2018-06-17 MED ORDER — DIVALPROEX SODIUM 125 MG PO CSDR: 250.0000 mg | DELAYED_RELEASE_CAPSULE | Freq: Three times a day (TID) | ORAL | 0 refills | Status: AC

## 2018-06-17 MED ORDER — DEXAMETHASONE 4 MG PO TABS: 4.0000 mg | ORAL_TABLET | Freq: Every day | ORAL | 0 refills | Status: AC

## 2018-06-17 MED ORDER — LORAZEPAM 1 MG PO TABS: 1.0000 mg | ORAL_TABLET | Freq: Four times a day (QID) | ORAL | 0 refills | Status: AC | PRN

## 2018-06-17 MED ORDER — CLONAZEPAM 0.5 MG PO TABS: 0.5000 mg | ORAL_TABLET | Freq: Two times a day (BID) | ORAL | 0 refills | Status: AC

## 2018-06-17 NOTE — Progress Notes (Signed)
Patient discharge to home via [place].  Patient is alert, confused,  Trying to get out of bed.    Vital sign stable.  no

## 2018-06-17 NOTE — Discharge Summary (Signed)
Physician Discharge Summary  Sara Silva QAS:[id number] DOB: 03-25-1941 DOA: 06/12/2018  PCP: Marjo Bicker, MD  Admit date: 06/12/2018 Discharge date: 06/17/2018  Admitted From: home Disposition:  SNF with hospice / palliative  Recommendations for Outpatient Follow-up:  1. Please ensure hospice/palliative follow up  Home Health: none Equipment/Devices: none  Discharge Condition: guarded, with hospice CODE STATUS: DNR Diet recommendation: regular  HPI: Per Dr. Maudie Mercury, Sara Silva  is a 77 y.o. female, w hypertension, hyperlipidemia, Dm2, Copd not on home o2, Dementia apparently presents with AMS, more agitation and memory loss per her daughter.   Hospital Course: Brain mass -Neurosurgery consulted, discussed with Dr. Christella Noa, patient underwent an MRI of the brain which showed peripherally contrast-enhancing lesion of the anterior left temporal lobe with severe surrounding vasogenic edema, suspicious for primary CNS neoplasm versus solitary metastasis.  It does not appear to be an abscess. Palliative care consulted and per POA's and family's wishes will not pursue further work-up, to be discharged to SNF with palliative follow-up, and hospice if she decompensates.  She will need to be on Decadron 4 mg daily per neurosurgery.  Diabetes mellitus -continue home regimen  Dementia with behavioral disturbances -continue current regimen  Goals of care -She is DNR, no further work-up per family/POA wishes    Discharge Diagnoses:  Principal Problem:   Brain tumor [facility]) Active Problems:   Tachycardia   Altered mental status   Protein-calorie malnutrition, severe   Brain metastases ([place])   Goals of care, counseling/discussion   Palliative care by specialist   Agitation     Discharge Instructions   Allergies as of 06/17/2018      Reactions   Sulfonamide Derivatives Hives   Dilaudid [hydromorphone Hcl] Other (See Comments)   Too sedating, almost stops breathing      Medication List    STOP taking these medications   aspirin EC 81 MG tablet     TAKE these medications   clonazePAM 0.5 MG tablet Commonly known as:  KLONOPIN Take 1 tablet (0.5 mg total) by mouth 2 (two) times daily. What changed:    when to take this  reasons to take this   dexamethasone 4 MG tablet Commonly known as:  DECADRON Take 1 tablet (4 mg total) by mouth daily. Start taking on:  06/18/2018   divalproex 125 MG capsule Commonly known as:  DEPAKOTE SPRINKLE Take 2 capsules (250 mg total) by mouth 3 (three) times daily.   lithium 300 MG tablet Take 1 tablet (300 mg total) by mouth daily.   LORazepam 1 MG tablet Commonly known as:  ATIVAN Take 1-2 tablets (1-2 mg total) by mouth every 6 (six) hours as needed for anxiety.   nitroGLYCERIN 0.4 MG SL tablet Commonly known as:  NITROSTAT Place 0.4 mg under the tongue every 5 (five) minutes as needed for chest pain.   omeprazole 20 MG capsule Commonly known as:  PRILOSEC Take 1 capsule (20 mg total) by mouth daily before breakfast.   QUEtiapine 100 MG tablet Commonly known as:  SEROQUEL Take 1 tablet (100 mg total) by mouth at bedtime.   TUMS PO Take 1 tablet by mouth daily as needed (heartburn).      Follow-up Information    Marjo Bicker, MD. Schedule an appointment as soon as possible for a visit in 1 week(s).   Specialty:  Internal Medicine Why:  for hospice follow up          Consultations:  Palliative care  Neurosurgery  Procedures/Studies:  Dg Chest 2 View  Result Date: 06/12/2018 CLINICAL DATA:  Altered mental status, current smoker. EXAM: CHEST - 2 VIEW COMPARISON:  05/21/2018 FINDINGS: Heart size is normal. Moderate aortic atherosclerosis without aneurysm. The lungs are hyperinflated without pulmonary consolidation, effusion or pneumothorax. The right lateral costophrenic angle is obscured by the patient's overlying arm. No acute osseous abnormality. IMPRESSION: Hyperinflated lungs  without active pulmonary disease. Electronically Signed   By: Ashley Royalty M.D.   On: 06/12/2018 19:30   Ct Head W Or Wo Contrast  Result Date: 06/12/2018 CLINICAL DATA:  77 y/o  F; headache, malignancy suspected. EXAM: CT HEAD WITHOUT AND WITH CONTRAST TECHNIQUE: Contiguous axial images were obtained from the base of the skull through the vertex without and with intravenous contrast CONTRAST:  52mL OMNIPAQUE IOHEXOL 300 MG/ML  SOLN COMPARISON:  05/21/2018 CT head.  05/21/2018 MRI head. FINDINGS: Brain: Left anterior temporal lobe intra-axial ring-enhancing lesion measuring 14 x 16 x 16 mm (AP x ML x CC series 5, image 9 and series 8, image 38). Severe edema throughout the left temporal lobe extending into the posterior insula progressed from the prior CT of the head. Associated mass effect with 4 mm of left-to-right midline shift and left uncal herniation. 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 Stable chronic microvascular ischemic changes and parenchymal volume loss of the brain. No downward herniation, extra-axial collection, or effacement of basilar cisterns. Vascular: No hyperdense vessel or unexpected calcification. Visible vessels are patent. Skull: Normal. Negative for fracture or focal lesion. Sinuses/Orbits: No acute finding. Other: None. IMPRESSION: Left anterior temporal lobe ring-enhancing lesion measuring 16 mm with increased vasogenic edema. Associated mass effect with 4 mm left-to-right midline shift and mild left uncal herniation. The lesion likely represents a metastasis or abscess. High-grade primary CNS neoplasm could also have this appearance. These results were called by telephone at the time of interpretation on 06/12/2018 at 9:23 pm to Dr. Deno Etienne , who verbally acknowledged these results. Electronically Signed   By: Kristine Garbe M.D.   On: 06/12/2018 21:24   Mr Jeri Cos TD Contrast  Result Date: 06/14/2018 CLINICAL DATA:  Left anterior temporal lobe  lesion. EXAM: MRI HEAD WITHOUT AND WITH CONTRAST TECHNIQUE: Multiplanar, multiecho pulse sequences of the brain and surrounding structures were obtained without and with intravenous contrast. CONTRAST:  63mL MULTIHANCE GADOBENATE DIMEGLUMINE 529 MG/ML IV SOLN COMPARISON:  Head CT with and without contrast 06/12/2018 Brain MRI 05/21/2018 FINDINGS: BRAIN: There is a large amount of edema throughout the left temporal lobe and extending into the left parietal white matter. There is no abnormal diffusion restriction. 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 Multifocal white matter hyperintensity, most commonly due to chronic ischemic microangiopathy. Rightward midline shift measures 3 mm. Minimal left uncus transtentorial herniation. Susceptibility-sensitive sequences show no chronic microhemorrhage or superficial siderosis. Peripherally contrast-enhancing lesion of the anterior left temporal lobe measures 13 x 17 x 14 mm. VASCULAR: Major intracranial arterial and venous sinus flow voids are preserved. SKULL AND UPPER CERVICAL SPINE: The visualized skull base, calvarium, upper cervical spine and extracranial soft tissues are normal. SINUSES/ORBITS: No fluid levels or advanced mucosal thickening. No mastoid or middle ear effusion. The orbits are normal. IMPRESSION: 1. Peripherally contrast-enhancing lesion of the anterior left temporal lobe with severe surrounding vasogenic edema. Primary CNS neoplasm is suspected, though a solitary metastasis is also a possibility. 2. No other contrast-enhancing lesions within the brain. No abnormal diffusion restriction to suggest abscess. 3. 3 mm rightward midline shift  and minimal left-sided uncal herniation, unchanged. Electronically Signed   By: Ulyses Jarred M.D.   On: 06/14/2018 01:37      Subjective: - no chest pain, shortness of breath, no abdominal pain, nausea or vomiting.   Discharge Exam: Vitals:   06/17/18 0400 06/17/18 0807  BP: 139/85 (!) 148/70  Pulse: 96  89  Resp:  18  Temp: 97.6 F (36.4 C) 97.9 F (36.6 C)  SpO2: 97% 92%   General: Pt is alert, awake, not in acute distress, confused Cardiovascular: RRR, S1/S2 +, no rubs, no gallops Respiratory: CTA bilaterally, no wheezing, no rhonchi Abdominal: Soft, NT, ND, bowel sounds +   The results of significant diagnostics from this hospitalization (including imaging, microbiology, ancillary and laboratory) are listed below for reference.     Microbiology: Recent Results (from the past 240 hour(s))  Urine culture     Status: None   Collection Time: 06/13/18 12:22 PM  Result Value Ref Range Status   Specimen Description URINE, CLEAN CATCH  Final   Special Requests NONE  Final   Culture   Final    NO GROWTH Performed at [facility], [address], [place],  [address]    Report Status 06/14/2018 FINAL  Final     Labs: BNP (last 3 results) No results for input(s): BNP in the last 8760 hours. Basic Metabolic Panel: Recent Labs  Lab 06/12/18 1924 06/12/18 1939  NA 140 139  K 3.6 4.3  CL 106 104  CO2 24  --   GLUCOSE 91 88  BUN 16 22  CREATININE 0.61 0.50  CALCIUM 9.1  --    Liver Function Tests: Recent Labs  Lab 06/12/18 1924  AST 16  ALT 11  ALKPHOS 66  BILITOT 0.5  PROT 5.7*  ALBUMIN 3.4*   No results for input(s): LIPASE, AMYLASE in the last 168 hours. Recent Labs  Lab 06/12/18 1924  AMMONIA 28   CBC: Recent Labs  Lab 06/12/18 1924 06/12/18 1939  WBC 8.1  --   NEUTROABS 4.7  --   HGB 11.9* 12.2  HCT 37.9 36.0  MCV 101.9*  --   PLT 354  --    Cardiac Enzymes: No results for input(s): CKTOTAL, CKMB, CKMBINDEX, TROPONINI in the last 168 hours. BNP: Invalid input(s): POCBNP CBG: Recent Labs  Lab 06/16/18 1615 06/16/18 2008 06/17/18 0017 06/17/18 0328 06/17/18 0803  GLUCAP 166* 101* 144* 127* 78   D-Dimer No results for input(s): DDIMER in the last 72 hours. Hgb A1c No results for input(s): HGBA1C in the last 72  hours. Lipid Profile No results for input(s): CHOL, HDL, LDLCALC, TRIG, CHOLHDL, LDLDIRECT in the last 72 hours. Thyroid function studies No results for input(s): TSH, T4TOTAL, T3FREE, THYROIDAB in the last 72 hours.  Invalid input(s): FREET3 Anemia work up No results for input(s): VITAMINB12, FOLATE, FERRITIN, TIBC, IRON, RETICCTPCT in the last 72 hours. Urinalysis    Component Value Date/Time   COLORURINE YELLOW 06/12/2018 1812   APPEARANCEUR CLEAR 06/12/2018 1812   LABSPEC 1.024 06/12/2018 1812   PHURINE 6.0 06/12/2018 1812   GLUCOSEU 150 (A) 06/12/2018 1812   HGBUR NEGATIVE 06/12/2018 1812   BILIRUBINUR NEGATIVE 06/12/2018 1812   KETONESUR NEGATIVE 06/12/2018 1812   PROTEINUR NEGATIVE 06/12/2018 1812   NITRITE NEGATIVE 06/12/2018 1812   LEUKOCYTESUR NEGATIVE 06/12/2018 1812   Sepsis Labs Invalid input(s): PROCALCITONIN,  WBC,  LACTICIDVEN   Time coordinating discharge: 35 minutes  SIGNED:  Marzetta Board, MD  [facility] 06/17/2018, 9:58 AM Pager [phone]  If 7PM-7AM, please contact night-coverage [email or web address] Password [email or web address]

## 2018-06-17 NOTE — Care Management Note (Addendum)
Case Management Note  Patient Details  Name: Sara Silva MRN: [id number] Date of Birth: [birth year]  Subjective/Objective:         Spoke w daughter Sara Silva at [phone]. She would like to take patient home and use [facility]. She states that she is hiring 24 hour caregivers to assist her at home. She requested hospital bed and 3/1. Discussed dispo w Dr Cruzita Lederer, and obtained orders. Daughter states that patient can come home prior to hospital being delivered, will utilize ger recliner as she did PTA. Requested all DME be delivered today. Hospice RN intake apt will be tomorrow. Referral placed to [facility] at [facility] [phone], and accepted, info routed to [phone]. Verified all info received. Patient will transport via [place]. Daughter Sara Silva will be home all day to accept patient. PTAR scheduled for 1:00 pick up. Daughter and bedside RN aware of DC time.             Action/Plan:  DC to home w home hospice Expected Discharge Date:  06/17/18               Expected Discharge Plan:  Home w Hospice Care  In-House Referral:  Clinical Social Work  Discharge planning Services  CM Consult  Post Acute Care Choice:    Choice offered to:  Adult Children  DME Arranged:    DME Agency:     HH Arranged:    HH Agency:  [facility]  Status of Service:  Completed, signed off  If discussed at [place] of Stay Meetings, dates discussed:    Additional Comments:  Carles Collet, RN 06/17/2018, 10:47 AM

## 2018-06-17 NOTE — Progress Notes (Signed)
    Durable Medical Equipment  (From admission, onward)        Start     Ordered   06/17/18 1040  For home use only DME Hospital bed  Once    Question Answer Comment  The above medical condition requires: Patient requires the ability to reposition frequently   Head must be elevated greater than: 45 degrees   Bed type Semi-electric      06/17/18 1040   06/17/18 1040  For home use only DME 3 n 1  Once     06/17/18 1040

## 2018-06-17 NOTE — Social Work (Addendum)
CSW spoke with [place] at [facility] in [place], pt able to transfer there if family desires. Marita [facility] states however that pt daughter [name] was in her office and has told Marita [place] that she has discussed the plan with her family and would prefer to take pt home with hospice.   CSW discussed this with MD and RN Case Management.  CSW called [name] at [phone]. Discussed Home with Hospice and reinforced that it is not 24/7 care, and that they would either need to provide 24/7 assistance with family or hired additional help. Pt daughter [name] states that they are hiring caregivers for pt and pt son in the home and again states that they would like pt to discharge home with hospice.   Verbal handoff given to RN Case Manager, will follow for any changes in disposition.    1:21pm- CSW aware pt discharging home with hospice.  CSW signing off. Please consult if any additional needs arise.  Alexander Mt, [place] Work [phone]

## 2018-07-03 DEATH — deceased

## 2018-09-15 ENCOUNTER — Ambulatory Visit (HOSPITAL_COMMUNITY): Payer: Self-pay | Admitting: Psychiatry

## 2018-10-05 IMAGING — MR MR HEAD WO/W CM
12 of 14 series · 40 of 48 positions shown · IV contrast (multihance)
Comparison: Head CT with and without contrast 06/12/2018

Brain MRI 05/21/2018

CLINICAL DATA: Left anterior temporal lobe lesion.

EXAM:
MRI HEAD WITHOUT AND WITH CONTRAST
TECHNIQUE: Multiplanar, multiecho pulse sequences of the brain and surrounding
structures were obtained without and with intravenous contrast.
CONTRAST:  9mL MULTIHANCE GADOBENATE DIMEGLUMINE 529 MG/ML IV SOLN

[Series 5: ax dwi_tracew · axial · 3.0mm · 1.50mm/px · z∈[-33,+108]mm · 6 of 86 slices shown]
[im 1/86]
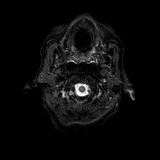
[im 18/86]
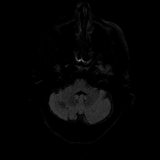
[im 35/86]
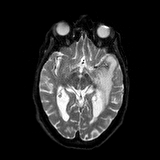
[im 52/86]
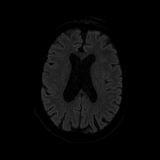
[im 69/86]
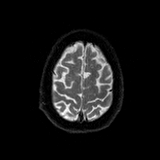
[im 86/86]
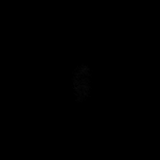

[Series 6: ax dwi_adc · axial · 3.0mm · 1.50mm/px · z∈[-33,+108]mm · 3 of 42 slices shown]
[im 1/42]
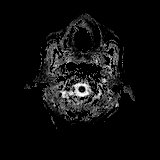
[im 21/42]
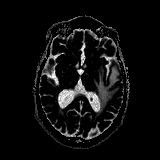
[im 42/42]
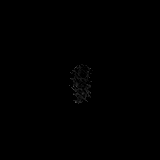

[Series 7: cor dwi_tracew · coronal · 5.0mm · 1.44mm/px · 6 of 72 slices shown]
[im 1/72]
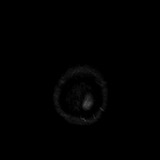
[im 15/72]
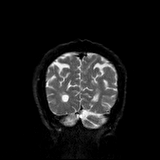
[im 29/72]
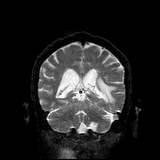
[im 43/72]
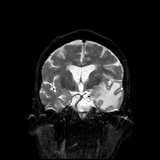
[im 57/72]
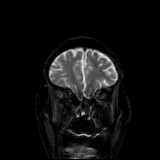
[im 72/72]
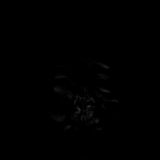

[Series 8: cor dwi_adc · coronal · 5.0mm · 1.44mm/px · 3 of 36 slices shown]
[im 1/36]
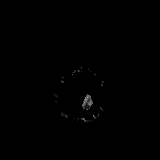
[im 18/36]
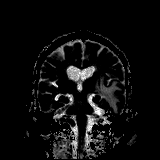
[im 36/36]
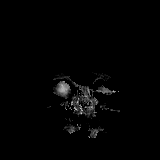

[Series 9: T1 · sagittal · 5.0mm · 0.75mm/px · 2 of 23 slices shown]
[im 1/23]
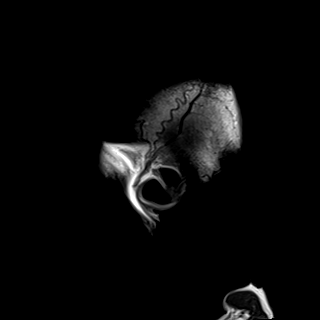
[im 23/23]
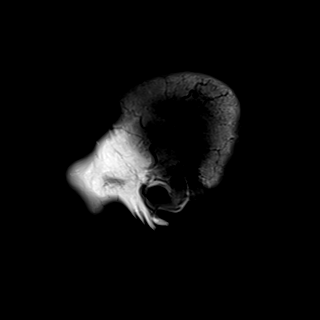

[Series 10: T2 · axial · 5.0mm · 0.72mm/px · z∈[-29,+109]mm · 2 of 25 slices shown]
[im 1/25]
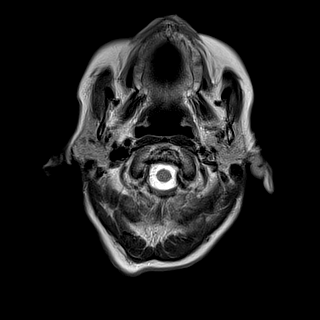
[im 25/25]
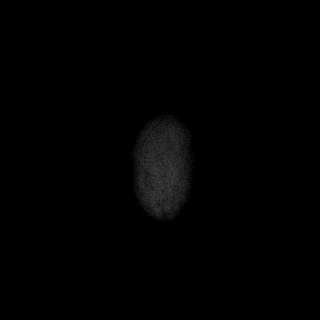

[Series 11: FLAIR · axial · 5.0mm · 0.45mm/px · z∈[-26,+113]mm · 2 of 25 slices shown]
[im 1/25]
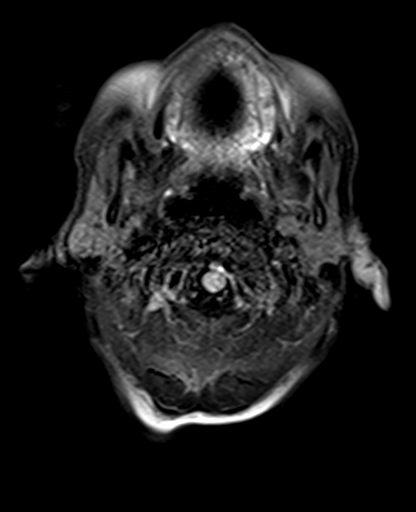
[im 25/25]
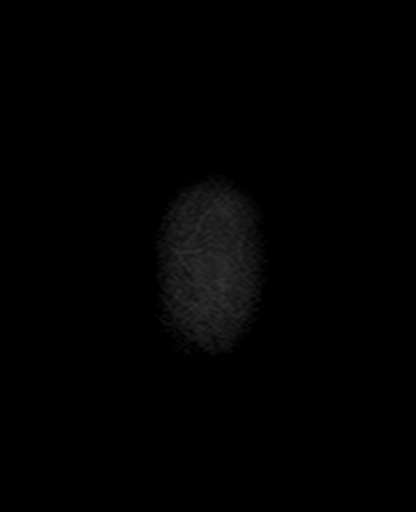

[Series 12: swi_images · axial · 3.0mm · 0.90mm/px · z∈[-26,+121]mm · 4 of 52 slices shown]
[im 1/52]
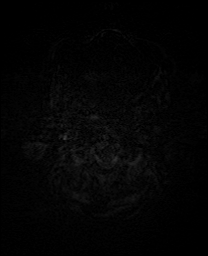
[im 18/52]
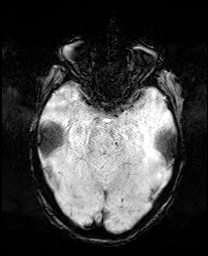
[im 35/52]
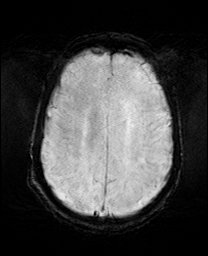
[im 52/52]
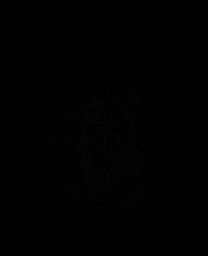

[Series 13: mip_images(sw) · axial · 24.0mm · 0.90mm/px · z∈[-16,+111]mm · 4 of 45 slices shown]
[im 1/45]
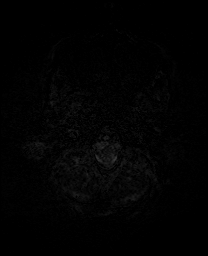
[im 15/45]
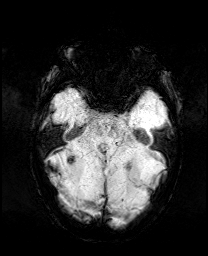
[im 30/45]
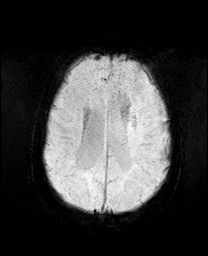
[im 45/45]
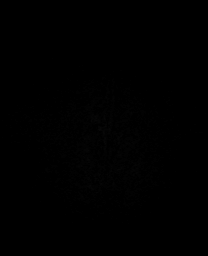

[Series 15: T2 post-contrast · coronal · 5.0mm · 0.72mm/px · 3 of 32 slices shown]
[im 1/32]
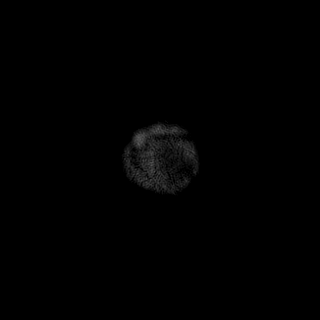
[im 16/32]
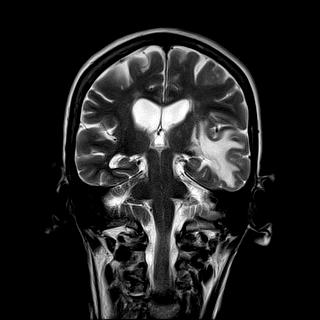
[im 32/32]
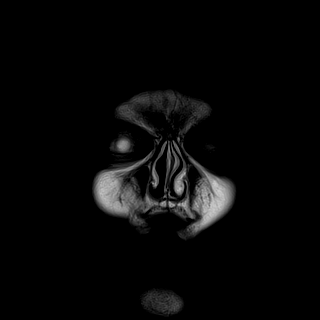

[Series 17: T1 post-contrast · coronal · 5.0mm · 0.34mm/px · 3 of 32 slices shown (1 of 2)]
[im 1/32]
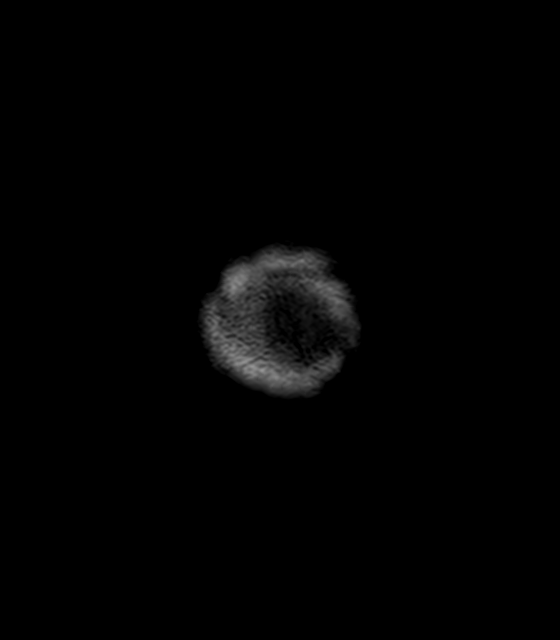
[im 16/32]
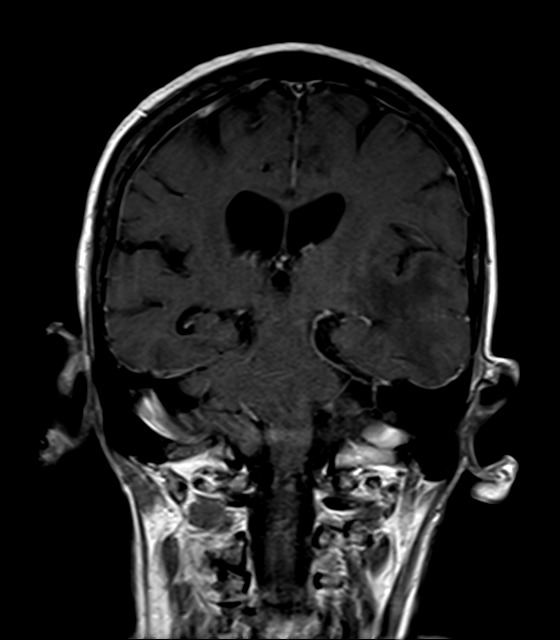
[im 32/32]
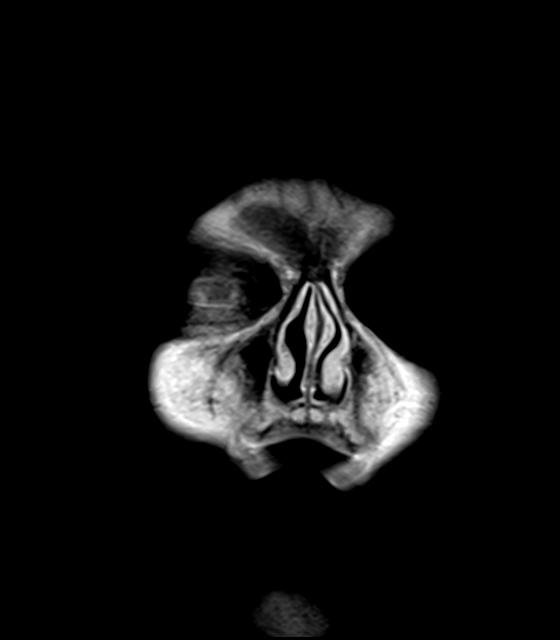

[Series 18: T1 post-contrast · sagittal · 5.0mm · 0.72mm/px · 2 of 23 slices shown (2 of 2)]
[im 1/23]
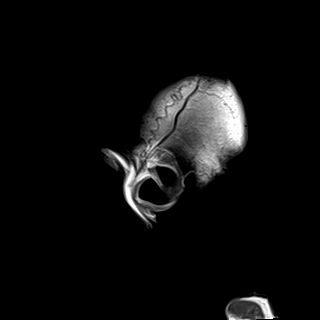
[im 23/23]
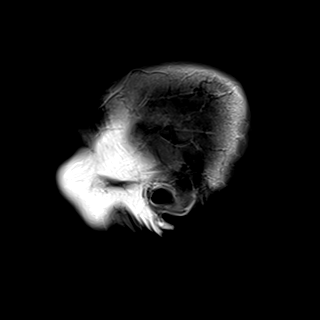

[40 of 48 positions shown; findings below may reference images not displayed]

FINDINGS: BRAIN: There is a large amount of edema throughout the left temporal
lobe and extending into the left parietal white matter. There is no
abnormal diffusion restriction. There are multiple old cerebellar
infarcts. Multifocal white matter hyperintensity, most commonly due
to chronic ischemic microangiopathy. Rightward midline shift
measures 3 mm. Minimal left uncus transtentorial herniation.
Susceptibility-sensitive sequences show no chronic microhemorrhage
or superficial siderosis. Peripherally contrast-enhancing lesion of
the anterior left temporal lobe measures 13 x 17 x 14 mm.

VASCULAR: Major intracranial arterial and venous sinus flow voids
are preserved.

SKULL AND UPPER CERVICAL SPINE: The visualized skull base,
calvarium, upper cervical spine and extracranial soft tissues are
normal.

SINUSES/ORBITS: No fluid levels or advanced mucosal thickening. No
mastoid or middle ear effusion. The orbits are normal.
IMPRESSION: 1. Peripherally contrast-enhancing lesion of the anterior left
temporal lobe with severe surrounding vasogenic edema. Primary CNS
neoplasm is suspected, though a solitary metastasis is also a
possibility.
2. No other contrast-enhancing lesions within the brain. No abnormal
diffusion restriction to suggest abscess.
3. 3 mm rightward midline shift and minimal left-sided uncal
herniation, unchanged.
